# Patient Record
Sex: Male | Born: 1980 | Hispanic: Yes | Marital: Single | Smoking: Never smoker
Health system: Southern US, Community
[De-identification: ages and names within clinical notes are randomized; demographics above are authoritative.]

## PROBLEM LIST (undated history)

## (undated) DIAGNOSIS — Z789 Other specified health status: Secondary | ICD-10-CM

## (undated) HISTORY — PX: APPENDECTOMY: SHX54

## (undated) HISTORY — PX: ANKLE FRACTURE SURGERY: SHX122

## (undated) HISTORY — PX: SMALL INTESTINE SURGERY: SHX150

---

## 2002-12-01 ENCOUNTER — Encounter: Payer: Self-pay | Admitting: *Deleted

## 2002-12-01 ENCOUNTER — Inpatient Hospital Stay (HOSPITAL_COMMUNITY): Admission: EM | Admit: 2002-12-01 | Discharge: 2002-12-02 | Payer: Self-pay | Admitting: *Deleted

## 2002-12-01 ENCOUNTER — Encounter (INDEPENDENT_AMBULATORY_CARE_PROVIDER_SITE_OTHER): Payer: Self-pay

## 2010-02-20 ENCOUNTER — Emergency Department (HOSPITAL_COMMUNITY): Admission: EM | Admit: 2010-02-20 | Discharge: 2010-02-20 | Payer: Self-pay | Admitting: Emergency Medicine

## 2011-02-27 LAB — RAPID STREP SCREEN (MED CTR MEBANE ONLY): Streptococcus, Group A Screen (Direct): NEGATIVE

## 2011-04-22 NOTE — Op Note (Signed)
   Jimmy Sanchez, Jimmy Sanchez                            ACCOUNT NO.:  [id number]   MEDICAL RECORD NO.:  [id number]                   PATIENT TYPE:  INP   LOCATION:  2852                                 FACILITY:  [facility]   PHYSICIAN:  Vikki Ports, M.D.         DATE OF BIRTH:  March 18, 1981   DATE OF PROCEDURE:  12/01/2002  DATE OF DISCHARGE:                                 OPERATIVE REPORT   PREOPERATIVE DIAGNOSIS:  Acute appendicitis.   POSTOPERATIVE DIAGNOSIS:  Acute appendicitis.   OPERATION/PROCEDURE:  Laparoscopic appendectomy.   SURGEON:  Vikki Ports, M.D.   ANESTHESIA:  General.   DESCRIPTION OF PROCEDURE:  The patient was taken to the operating room,  placed in the supine position after adequate anesthesia was induced using  endotracheal tube.  The abdomen was prepped and draped in the normal sterile  fashion.  Using a small vertical midline supraumbilical incision, I  dissected down to the fascia.  The fascia was opened vertically.  An 0  Vicryl pursestring suture was placed around the fascial defect.  Peritoneum  was entered with the Hasson trocar.  Pneumoperitoneum was obtained.  Under  direct visualization a 5 mm port was placed in the right upper quadrant and  after clearing intestinal incisions to the right lower quadrant, a 12 mm  trocar was placed in the left paramedian supraumbilical region.  The cecum  was identified.  A very thickened and inflamed appendix was mobilized up and  out of the pelvis.  The mesoappendix was taken down with the Harmonic  scalpel.  The base of the appendix was transected using an endoscopic GIA  stapling device.  Appendix was placed in an endocatch bag and removed  through the umbilical port.  Pelvis and right lower quadrant were copiously  irrigated.  Adequate hemostasis was insured.  The supraumbilical fascial  defect was closed with the 0 Vicryl pursestring suture.  All other incisions  were closed.  The skin incisions were  closed with staples.  The patient  tolerated the procedure well and went to PACU in good condition.                                               Vikki Ports, M.D.    KRH/MEDQ  D:  12/01/2002  T:  12/01/2002  Job:  [id number]

## 2011-11-13 ENCOUNTER — Ambulatory Visit (INDEPENDENT_AMBULATORY_CARE_PROVIDER_SITE_OTHER): Payer: BC Managed Care – PPO

## 2011-11-13 DIAGNOSIS — Z139 Encounter for screening, unspecified: Secondary | ICD-10-CM

## 2012-09-12 ENCOUNTER — Telehealth: Payer: Self-pay

## 2012-09-12 ENCOUNTER — Ambulatory Visit (INDEPENDENT_AMBULATORY_CARE_PROVIDER_SITE_OTHER): Payer: BC Managed Care – PPO | Admitting: Family Medicine

## 2012-09-12 VITALS — BP 105/62 | HR 55 | Temp 97.9°F | Resp 16 | Ht 67.5 in | Wt 166.0 lb

## 2012-09-12 DIAGNOSIS — R42 Dizziness and giddiness: Secondary | ICD-10-CM

## 2012-09-12 DIAGNOSIS — R110 Nausea: Secondary | ICD-10-CM

## 2012-09-12 LAB — POCT URINALYSIS DIPSTICK
Bilirubin, UA: NEGATIVE
Blood, UA: NEGATIVE
Glucose, UA: NEGATIVE
Ketones, UA: NEGATIVE
Leukocytes, UA: NEGATIVE
Nitrite, UA: NEGATIVE
Protein, UA: NEGATIVE
Spec Grav, UA: 1.01
Urobilinogen, UA: 0.2
pH, UA: 6

## 2012-09-12 LAB — POCT UA - MICROSCOPIC ONLY
Casts, Ur, LPF, POC: NEGATIVE
Crystals, Ur, HPF, POC: NEGATIVE
Mucus, UA: NEGATIVE
Yeast, UA: NEGATIVE

## 2012-09-12 LAB — POCT CBC
Granulocyte percent: 58.9 % (ref 37–80)
HCT, POC: 46.5 % (ref 43.5–53.7)
Hemoglobin: 14.9 g/dL (ref 14.1–18.1)
Lymph, poc: 2 (ref 0.6–3.4)
MCH, POC: 30.2 pg (ref 27–31.2)
MCHC: 32 g/dL (ref 31.8–35.4)
MCV: 94.3 fL (ref 80–97)
MID (cbc): 0.4 (ref 0–0.9)
MPV: 8.3 fL (ref 0–99.8)
POC Granulocyte: 3.5 (ref 2–6.9)
POC LYMPH PERCENT: 34.2 % (ref 10–50)
POC MID %: 6.9 % (ref 0–12)
Platelet Count, POC: 294 K/uL (ref 142–424)
RBC: 4.93 M/uL (ref 4.69–6.13)
RDW, POC: 13.4 %
WBC: 5.9 K/uL (ref 4.6–10.2)

## 2012-09-12 LAB — GLUCOSE, POCT (MANUAL RESULT ENTRY): POC Glucose: 83 mg/dL (ref 70–99)

## 2012-09-12 MED ORDER — ONDANSETRON HCL 8 MG PO TABS: 8.0000 mg | ORAL_TABLET | Freq: Three times a day (TID) | ORAL | Status: DC | PRN

## 2012-09-12 NOTE — Telephone Encounter (Signed)
PT IS WANTING TO KNOW IF YOU CAN TEST FOR CARBON MONOXIDE WITH THE BLOOD THAT WAS DRAWN WHEN PT WAS SEEN IN OFFICE ON 09-12-12 [phone]

## 2012-09-12 NOTE — Progress Notes (Signed)
[facility] [address] [place] [address] [phone]  Date:  09/12/2012   Name:  Jimmy Sanchez   DOB:  10-08-1981   MRN:  [id number]  PCP:  No primary provider on file.    Chief Complaint: Nausea, Dizziness and Chills   History of Present Illness:  Jimmy Sanchez is a 31 y.o. very pleasant male patient who presents with the following:  He felt nauseated and lightheaded this morning.  Symptoms first started after he had gotten ready for the day and eaten breakfast.   No abdominal pain.   He feels weak and tired but no specific symptoms. .   Nothing hurts.   He did have a problem with his throat a couple of days ago.  His partner notes that sometimes at night it sounds like he is choking on very large tonsils.  This is not new  No cough, no fever.   He is usually healthy. No vomiting or diarrhea.  No medications tried.  No Rx medications, but he does take a supplement "for his bones."     Also states that sometimes- not now- he has RUQ pains.   There is no problem list on file for this patient.   No past medical history on file.  No past surgical history on file.  History  Substance Use Topics  . Smoking status: Never Smoker   . Smokeless tobacco: Not on file  . Alcohol Use: Yes    No family history on file.  No Known Allergies  Medication list has been reviewed and updated.  No current outpatient prescriptions on file prior to visit.    Review of Systems:  As per HPI- otherwise negative.   Physical Examination: Filed Vitals:   09/12/12 1155  BP: 105/62  Pulse: 55  Temp: 97.9 F (36.6 C)  Resp: 16   Filed Vitals:   09/12/12 1155  Height: 5' 7.5" (1.715 m)  Weight: 166 lb (75.297 kg)   Body mass index is 25.62 kg/(m^2). Ideal Body Weight: Weight in (lb) to have BMI = 25: 161.7   GEN: WDWN, NAD, Non-toxic, A & O x 3, looks well HEENT: Atraumatic, Normocephalic. Neck supple. No masses, No LAD. Bilateral TM wnl, oropharynx  normal. Tonsils are small, uvula wnl.  PEERL,EOMI.   Ears and Nose: No external deformity. CV: RRR, No M/G/R. No JVD. No thrill. No extra heart sounds. PULM: CTA B, no wheezes, crackles, rhonchi. No retractions. No resp. distress. No accessory muscle use. ABD: S, NT, ND, +BS. No rebound. No HSM.  Healed surgical scars.  EXTR: No c/c/e NEURO Normal gait.  PSYCH: Normally interactive. Conversant. Not depressed or anxious appearing.  Calm demeanor.   Results for orders placed in visit on 09/12/12  POCT CBC      Component Value Range   WBC 5.9  4.6 - 10.2 K/uL   Lymph, poc 2.0  0.6 - 3.4   POC LYMPH PERCENT 34.2  10 - 50 %L   MID (cbc) 0.4  0 - 0.9   POC MID % 6.9  0 - 12 %M   POC Granulocyte 3.5  2 - 6.9   Granulocyte percent 58.9  37 - 80 %G   RBC 4.93  4.69 - 6.13 M/uL   Hemoglobin 14.9  14.1 - 18.1 g/dL   HCT, POC 69.6  29.5 - 53.7 %   MCV 94.3  80 - 97 fL   MCH, POC 30.2  27 - 31.2 pg  MCHC 32.0  31.8 - 35.4 g/dL   RDW, POC 47.8     Platelet Count, POC 294  142 - 424 K/uL   MPV 8.3  0 - 99.8 fL  GLUCOSE, POCT (MANUAL RESULT ENTRY)      Component Value Range   POC Glucose 83  70 - 99 mg/dl  POCT UA - MICROSCOPIC ONLY      Component Value Range   WBC, Ur, HPF, POC rare     RBC, urine, microscopic rare     Bacteria, U Microscopic trace     Mucus, UA neg     Epithelial cells, urine per micros 0-1     Crystals, Ur, HPF, POC neg     Casts, Ur, LPF, POC neg     Yeast, UA neg    POCT URINALYSIS DIPSTICK      Component Value Range   Color, UA yellow     Clarity, UA clear     Glucose, UA neg     Bilirubin, UA neg     Ketones, UA neg     Spec Grav, UA 1.010     Blood, UA neg     pH, UA 6.0     Protein, UA neg     Urobilinogen, UA 0.2     Nitrite, UA neg     Leukocytes, UA Negative      Assessment and Plan: 1. Nausea  Comprehensive metabolic panel, ondansetron (ZOFRAN) 8 MG tablet  2. Lightheaded  POCT CBC, POCT glucose (manual entry), POCT UA - Microscopic Only, POCT  urinalysis dipstick   31 year old male with non- specific symptoms and reassuring exam/ VS and labs today.  Encouraged him to rest and drink plenty of fluids.  He may be coming down with a viral illness.  zofran as needed for nausea.  Let me know if not better in the next day or so- Sooner if worse.   Will follow- up with CMP result as well.  Will check CMP as he sometimes has RUQ pains COPLAND,JESSICA, MD

## 2012-09-13 ENCOUNTER — Telehealth: Payer: Self-pay | Admitting: *Deleted

## 2012-09-13 LAB — COMPREHENSIVE METABOLIC PANEL
ALT: 14 U/L (ref 0–53)
AST: 20 U/L (ref 0–37)
Albumin: 5 g/dL (ref 3.5–5.2)
Alkaline Phosphatase: 44 U/L (ref 39–117)
BUN: 11 mg/dL (ref 6–23)
CO2: 28 mEq/L (ref 19–32)
Calcium: 10 mg/dL (ref 8.4–10.5)
Chloride: 104 mEq/L (ref 96–112)
Creat: 0.87 mg/dL (ref 0.50–1.35)
Glucose, Bld: 83 mg/dL (ref 70–99)
Potassium: 4.6 mEq/L (ref 3.5–5.3)
Sodium: 138 mEq/L (ref 135–145)
Total Bilirubin: 0.5 mg/dL (ref 0.3–1.2)
Total Protein: 7.9 g/dL (ref 6.0–8.3)

## 2012-09-13 NOTE — Telephone Encounter (Signed)
Left a message for patient. Informed him that his lab work was normal and to call back if he was not feeling better.

## 2012-09-13 NOTE — Telephone Encounter (Signed)
No, I do not think that we can test for carbon monoxide.  Is he concerned about this?  If he is afraid that he has carbon monoxide exposure he probably should go to the ER.  What is going on?

## 2012-09-13 NOTE — Telephone Encounter (Signed)
Called patient, left message to advise. 

## 2012-09-13 NOTE — Telephone Encounter (Signed)
He is instructed to call me back so I can advise on this, need to know why he thinks he has Carbon Monoxide poisoning.

## 2012-09-16 NOTE — Telephone Encounter (Signed)
Called patient. There is a language barrier. He is advised we can not test for this, he did not indicate why he thinks he had carbon monoxide poisoning.

## 2013-07-25 ENCOUNTER — Emergency Department (HOSPITAL_COMMUNITY): Payer: BC Managed Care – PPO

## 2013-07-25 ENCOUNTER — Inpatient Hospital Stay (HOSPITAL_COMMUNITY)
Admission: EM | Admit: 2013-07-25 | Discharge: 2013-07-25 | DRG: 814 | Discharge status: Against Medical Advice | Payer: BC Managed Care – PPO | Attending: Internal Medicine | Admitting: Internal Medicine

## 2013-07-25 ENCOUNTER — Encounter (HOSPITAL_COMMUNITY): Payer: Self-pay | Admitting: Emergency Medicine

## 2013-07-25 DIAGNOSIS — K56609 Unspecified intestinal obstruction, unspecified as to partial versus complete obstruction: Secondary | ICD-10-CM

## 2013-07-25 DIAGNOSIS — R112 Nausea with vomiting, unspecified: Secondary | ICD-10-CM | POA: Diagnosis present

## 2013-07-25 DIAGNOSIS — K56600 Partial intestinal obstruction, unspecified as to cause: Secondary | ICD-10-CM

## 2013-07-25 DIAGNOSIS — R109 Unspecified abdominal pain: Secondary | ICD-10-CM | POA: Diagnosis present

## 2013-07-25 DIAGNOSIS — Z9089 Acquired absence of other organs: Secondary | ICD-10-CM

## 2013-07-25 HISTORY — DX: Other specified health status: Z78.9

## 2013-07-25 LAB — CBC WITH DIFFERENTIAL/PLATELET
Eosinophils Absolute: 0.3 10*3/uL (ref 0.0–0.7)
Eosinophils Relative: 0 % (ref 0–5)
Eosinophils Relative: 3 % (ref 0–5)
HCT: 40.8 % (ref 39.0–52.0)
HCT: 42.6 % (ref 39.0–52.0)
Hemoglobin: 14.8 g/dL (ref 13.0–17.0)
Hemoglobin: 15.1 g/dL (ref 13.0–17.0)
Lymphocytes Relative: 10 % — ABNORMAL LOW (ref 12–46)
Lymphs Abs: 2.1 10*3/uL (ref 0.7–4.0)
MCH: 31 pg (ref 26.0–34.0)
MCV: 86.8 fL (ref 78.0–100.0)
MCV: 87.5 fL (ref 78.0–100.0)
Monocytes Absolute: 0.3 10*3/uL (ref 0.1–1.0)
Monocytes Absolute: 0.7 10*3/uL (ref 0.1–1.0)
Monocytes Relative: 3 % (ref 3–12)
Monocytes Relative: 5 % (ref 3–12)
Neutro Abs: 8.3 10*3/uL — ABNORMAL HIGH (ref 1.7–7.7)
RBC: 4.87 MIL/uL (ref 4.22–5.81)
RDW: 12.9 % (ref 11.5–15.5)
WBC: 9.6 10*3/uL (ref 4.0–10.5)

## 2013-07-25 LAB — COMPREHENSIVE METABOLIC PANEL
AST: 19 U/L (ref 0–37)
Albumin: 3.9 g/dL (ref 3.5–5.2)
Alkaline Phosphatase: 40 U/L (ref 39–117)
BUN: 21 mg/dL (ref 6–23)
BUN: 16 mg/dL (ref 6–23)
Calcium: 9.5 mg/dL (ref 8.4–10.5)
Chloride: 106 mEq/L (ref 96–112)
GFR calc Af Amer: >90 mL/min (ref >90)
GFR calc non Af Amer: >90 mL/min (ref >90)
Glucose, Bld: 100 mg/dL — ABNORMAL HIGH (ref 70–99)
Potassium: 3.9 mEq/L (ref 3.5–5.1)
Total Bilirubin: 0.4 mg/dL (ref 0.3–1.2)
Total Protein: 7.7 g/dL (ref 6.0–8.3)
Total Protein: 7 g/dL (ref 6.0–8.3)

## 2013-07-25 LAB — URINALYSIS, ROUTINE W REFLEX MICROSCOPIC
Leukocytes, UA: NEGATIVE
Nitrite: NEGATIVE
Specific Gravity, Urine: 1.024 (ref 1.005–1.030)
Urobilinogen, UA: 0.2 mg/dL (ref 0.0–1.0)
pH: 7.5 (ref 5.0–8.0)

## 2013-07-25 LAB — LACTIC ACID, PLASMA: Lactic Acid, Venous: 0.5 mmol/L (ref 0.5–2.2)

## 2013-07-25 LAB — RAPID URINE DRUG SCREEN, HOSP PERFORMED: Benzodiazepines: NOT DETECTED

## 2013-07-25 LAB — LIPASE, BLOOD: Lipase: 28 U/L (ref 11–59)

## 2013-07-25 MED ORDER — SODIUM CHLORIDE 0.9 % IV SOLN
INTRAVENOUS | Status: DC
  Administered 2013-07-25: 06:00:00 via INTRAVENOUS

## 2013-07-25 MED ORDER — HYDROMORPHONE HCL PF 1 MG/ML IJ SOLN
1.0000 mg | Freq: Once | INTRAMUSCULAR | Status: AC
  Administered 2013-07-25: 1 mg via INTRAVENOUS
  Filled 2013-07-25: qty 1

## 2013-07-25 MED ORDER — ONDANSETRON HCL 4 MG PO TABS: 4.0000 mg | ORAL_TABLET | Freq: Four times a day (QID) | ORAL | Status: DC | PRN

## 2013-07-25 MED ORDER — CIPROFLOXACIN IN D5W 400 MG/200ML IV SOLN
400.0000 mg | Freq: Two times a day (BID) | INTRAVENOUS | Status: DC
  Administered 2013-07-25: 400 mg via INTRAVENOUS
  Filled 2013-07-25 (×2): qty 200

## 2013-07-25 MED ORDER — FENTANYL CITRATE 0.05 MG/ML IJ SOLN
50.0000 ug | Freq: Once | INTRAMUSCULAR | Status: AC
  Administered 2013-07-25: 50 ug via INTRAVENOUS
  Filled 2013-07-25: qty 2

## 2013-07-25 MED ORDER — ONDANSETRON HCL 4 MG/2ML IJ SOLN
4.0000 mg | Freq: Once | INTRAMUSCULAR | Status: AC
  Administered 2013-07-25: 4 mg via INTRAVENOUS
  Filled 2013-07-25: qty 2

## 2013-07-25 MED ORDER — ONDANSETRON HCL 4 MG/2ML IJ SOLN
4.0000 mg | Freq: Four times a day (QID) | INTRAMUSCULAR | Status: DC | PRN
  Administered 2013-07-25: 4 mg via INTRAVENOUS
  Filled 2013-07-25: qty 2

## 2013-07-25 MED ORDER — IOHEXOL 300 MG/ML  SOLN
80.0000 mL | Freq: Once | INTRAMUSCULAR | Status: AC | PRN
  Administered 2013-07-25: 80 mL via INTRAVENOUS

## 2013-07-25 MED ORDER — HYDROMORPHONE HCL PF 1 MG/ML IJ SOLN
1.0000 mg | INTRAMUSCULAR | Status: DC | PRN
  Administered 2013-07-25: 1 mg via INTRAVENOUS
  Filled 2013-07-25 (×2): qty 1

## 2013-07-25 MED ORDER — SODIUM CHLORIDE 0.9 % IV BOLUS (SEPSIS)
1000.0000 mL | Freq: Once | INTRAVENOUS | Status: AC
  Administered 2013-07-25: 1000 mL via INTRAVENOUS

## 2013-07-25 MED ORDER — ACETAMINOPHEN 650 MG RE SUPP: 650.0000 mg | Freq: Four times a day (QID) | RECTAL | Status: DC | PRN

## 2013-07-25 MED ORDER — METRONIDAZOLE IN NACL 5-0.79 MG/ML-% IV SOLN
500.0000 mg | Freq: Three times a day (TID) | INTRAVENOUS | Status: DC
  Administered 2013-07-25: 500 mg via INTRAVENOUS
  Filled 2013-07-25 (×3): qty 100

## 2013-07-25 MED ORDER — ACETAMINOPHEN 325 MG PO TABS: 650.0000 mg | ORAL_TABLET | Freq: Four times a day (QID) | ORAL | Status: DC | PRN

## 2013-07-25 NOTE — Progress Notes (Signed)
Pt states " I feel much better after I vomited, call doctor and see if I can be released"? This nurse called doctor and doctor advised not to leave and to stay overnight for observation. Patient was informed about leaving against medical advise and stated "okay Im leaving".

## 2013-07-25 NOTE — Progress Notes (Signed)
32yo male c/o mid-abdominal pain, denies N/V, to begin IV ABX for enteritis.  Will start Cipro 400mg  IV Q12H for CrCl >100 and monitor clinical progression.  Vernard Gambles, PharmD, BCPS  07/25/2013 5:56 AM

## 2013-07-25 NOTE — ED Notes (Signed)
PT. REPORTS MID ABDOMINAL PAIN ONSET THIS EVENING , DENIES NAUSEA OR VOMITTING , NO DIARRHEA OR FEVER .

## 2013-07-25 NOTE — H&P (Signed)
[facility] History and Physical  Donevan Biller JYN:[id number] DOB: 1980-12-22 DOA: 07/25/2013  Referring physician: ER physician. PCP: No PCP Per Patient   Chief Complaint: Abdominal pain.  HPI: Jimmy Sanchez is a 32 y.o. male with no significant past medical history presents with complaints of abdominal pain. Patient's abdominal pain started last night around 9 PM. The pain was periumbilical crampy in nature which became more persistent. Denies any nausea vomiting or diarrhea. Denies any fever chills. Denies any similar episodes previously. Patient has had abdominal surgery as a child the cause of which is not known and also had appendectomy later. In the ER CT abdomen shows possibility of early small bowel obstruction/enteritis/decreased motolity/malabsorption. Patient's pain improved with Dilaudid. Patient at this time has been admitted for further observation. Patient presently is not in acute distress. On exam patient has a mild tenderness around the periumbilical area. Patient's last bowel movement was last night. Denies any diarrhea. Patient has not had any weight loss or joint symptoms.  Review of Systems: As presented in the history of presenting illness, rest negative.  Past Medical History  Diagnosis Date  . Medical history non-contributory    Past Surgical History  Procedure Laterality Date  . Appendectomy    . Small intestine surgery      as an infant   Social History:  reports that he has never smoked. He does not have any smokeless tobacco history on file. He reports that  drinks alcohol. His drug history is not on file. Home. where does patient live-- Can do ADLs. Can patient participate in ADLs?  No Known Allergies  Family History  Problem Relation Age of Onset  . Other Neg Hx       Prior to Admission medications   Not on File   Physical Exam: Filed Vitals:   07/25/13 0007 07/25/13 0145 07/25/13 0200 07/25/13 0431  BP: 115/74 114/53 104/53 128/73   Pulse: 82 68 63 62  Temp: 98.6 F (37 C)   98.2 F (36.8 C)  TempSrc: Oral     Resp: 20   18  Height:    5\' 8"  (1.727 m)  Weight:    79 kg (174 lb 2.6 oz)  SpO2: 97% 95% 96% 99%     General:  Well-developed and nourished.  Eyes: Anicteric no pallor.  ENT: No discharge from the ears eyes nose mouth.  Neck: No mass felt.  Cardiovascular: S1-S2 heard.  Respiratory: No rhonchi or crepitations.  Abdomen: Soft mild tenderness around the umbilicus. No guarding or rigidity. Bowel sounds present.  Skin: No rash.  Musculoskeletal: No joint effusions.  Psychiatric: Appears normal.  Neurologic: Alert awake oriented to time place and person. Moves all extremities.  Labs on Admission:  Basic Metabolic Panel:  Recent Labs Lab 07/25/13 0015  NA 140  K 3.7  CL 103  CO2 26  GLUCOSE 100*  BUN 21  CREATININE 0.79  CALCIUM 9.5   Liver Function Tests:  Recent Labs Lab 07/25/13 0015  AST 22  ALT 19  ALKPHOS 47  BILITOT 0.3  PROT 7.7  ALBUMIN 4.5    Recent Labs Lab 07/25/13 0015  LIPASE 28   No results found for this basename: AMMONIA,  in the last 168 hours CBC:  Recent Labs Lab 07/25/13 0015  WBC 13.4*  NEUTROABS 10.2*  HGB 15.1  HCT 42.6  MCV 87.5  PLT 240   Cardiac Enzymes: No results found for this basename: CKTOTAL, CKMB, CKMBINDEX, TROPONINI,  in the last  168 hours  BNP (last 3 results) No results found for this basename: PROBNP,  in the last 8760 hours CBG: No results found for this basename: GLUCAP,  in the last 168 hours  Radiological Exams on Admission: Ct Abdomen Pelvis W Contrast  07/25/2013   *RADIOLOGY REPORT*  Clinical Data: Abdominal pain, onset this evening.  Dilated small bowel on plain film.  CT ABDOMEN AND PELVIS WITH CONTRAST  Technique:  Multidetector CT imaging of the abdomen and pelvis was performed following the standard protocol during bolus administration of intravenous contrast.  Contrast: 80mL OMNIPAQUE IOHEXOL 300  MG/ML  SOLN  Comparison: Abdominal series 07/25/2013  Findings: Mild dependent changes in the lung bases.  Sub centimeter low attenuation lesion in the medial segment left lobe of the liver likely representing small cyst.  Liver parenchyma is otherwise homogeneous.  The gallbladder, spleen, pancreas, adrenal glands, kidneys, abdominal aorta, inferior vena cava, and retroperitoneal lymph nodes are unremarkable.  There is prominent gastric distension with food and contrast material in the stomach. Contrast material does flow into the duodenum and there is no obvious obstructing lesion.  Changes may be due to dysmotility or gastroenteritis.  Proximal small bowel are decompressed.  The colon is mostly decompressed.  The distal small bowel are mildly dilated and filled with fluid and gas.  Findings could represent early distal obstruction, enteritis, decreased motility with stasis or malabsorption process.  No free air or free fluid in the abdomen. Small periumbilical hernia containing fat.  Pelvis:  Prostate gland is not enlarged.  Bladder wall is not thickened.  Calcification versus surgical clip in the right pelvis. Appendix is not visualized consistent with surgical absence. No free or loculated pelvic fluid collections.  No evidence of diverticulitis.  Normal alignment of the lumbar spine.  No destructive bone lesions appreciated.  IMPRESSION:  Mildly dilated distal small bowel which is filled with fluid and gas.  Colon is decompressed.  Changes suggest early distal obstruction, enteritis, decreased motility, or malabsorption.  The stomach is distended without obvious mass lesion.   Original Report Authenticated By: Burman Nieves, M.D.   Dg Abd Acute W/chest  07/25/2013   *RADIOLOGY REPORT*  Clinical Data: Severe abdominal pain.  History of surgery is in the for small bowel obstruction.  Lower abdominal pain started 4 hours ago.  ACUTE ABDOMEN SERIES (ABDOMEN 2 VIEW & CHEST 1 VIEW)  Comparison: Chest 06/26/2013   Findings: The The heart size and pulmonary vascularity are normal. The lungs appear clear and expanded without focal air space disease or consolidation. No blunting of the costophrenic angles.  No pneumothorax.  Mediastinal contours appear intact.  No significant change since previous study.  Stomach appears distended with fluid.  Mild gaseous distension of mid abdominal small bowel loops with a few air-fluid levels. Changes suggest early obstruction. Ileus is less likely.  Stool in the colon without distension.  No free intra-abdominal air. Surgical clips in the right lower quadrant.  No radiopaque stones. Visualized bones appear intact.  IMPRESSION: The mild gaseous distension and air-fluid levels and mid abdominal small bowel loops.  Stomach appears distended with fluid.  Changes suggest early obstruction.   Original Report Authenticated By: Burman Nieves, M.D.     Assessment/Plan Principal Problem:   Abdominal pain   1. Abdominal pain - possibilities include early small bowel obstruction versus enteritis. Other causes include malabsorption and decreased motility. Patient does have bowel sounds. At this time I am placing patient n.p.o. Pain relief medications. Cipro and Flagyl(possible  antritis) and may repeat KUB later in the day    Code Status: Full code.  Family Communication: None.  Disposition Plan: Admit to inpatient.    Darcie Mellone N. [facility] Pager [phone].  If 7PM-7AM, please contact night-coverage [email or web address] Password [facility] 07/25/2013, 5:49 AM

## 2013-07-25 NOTE — ED Provider Notes (Signed)
CSN: [id number]     Arrival date & time 07/25/13  0001 History     First MD Initiated Contact with Patient 07/25/13 0047     Chief Complaint  Patient presents with  . Abdominal Pain   (Consider location/radiation/quality/duration/timing/severity/associated sxs/prior Treatment) HPI 32 yo male presents to the ER from home with complaint of acute lower abdominal pain this evening around 9 pm.  Pt had BM to see if it would relieve the pain, took alka-seltzer as well without improvement in pain.  No nausea or vomiting, no fevers.  Pt with pmh of appendectomy and small intestine surgery as infant due to "being stuck together".  No prior h/o SBO.  Pain is sharp, crampy.    History reviewed. No pertinent past medical history. Past Surgical History  Procedure Laterality Date  . Appendectomy    . Small intestine surgery      as an infant   No family history on file. History  Substance Use Topics  . Smoking status: Never Smoker   . Smokeless tobacco: Not on file  . Alcohol Use: Yes    Review of Systems  All other systems reviewed and are negative.    Allergies  Review of patient's allergies indicates no known allergies.  Home Medications  No current outpatient prescriptions on file. BP 104/53  Pulse 63  Temp(Src) 98.6 F (37 C) (Oral)  Resp 20  SpO2 96% Physical Exam  Nursing note and vitals reviewed. Constitutional: He is oriented to person, place, and time. He appears well-developed and well-nourished.  HENT:  Head: Normocephalic and atraumatic.  Right Ear: External ear normal.  Left Ear: External ear normal.  Nose: Nose normal.  Mouth/Throat: Oropharynx is clear and moist.  Eyes: Conjunctivae and EOM are normal. Pupils are equal, round, and reactive to light.  Neck: Normal range of motion. Neck supple. No JVD present. No tracheal deviation present. No thyromegaly present.  Cardiovascular: Normal rate, regular rhythm, normal heart sounds and intact distal pulses.  Exam  reveals no gallop and no friction rub.   No murmur heard. Pulmonary/Chest: Effort normal and breath sounds normal. No stridor. No respiratory distress. He has no wheezes. He has no rales. He exhibits no tenderness.  Abdominal: Soft. Bowel sounds are normal. He exhibits no distension and no mass. There is no tenderness. There is no rebound and no guarding.  Musculoskeletal: Normal range of motion. He exhibits no edema and no tenderness.  Lymphadenopathy:    He has no cervical adenopathy.  Neurological: He is oriented to person, place, and time. He has normal reflexes. No cranial nerve deficit. He exhibits normal muscle tone. Coordination normal.  Skin: Skin is dry. No rash noted. No erythema. No pallor.  Psychiatric: He has a normal mood and affect. His behavior is normal. Judgment and thought content normal.    ED Course   Procedures (including critical care time)  Labs Reviewed  CBC WITH DIFFERENTIAL - Abnormal; Notable for the following:    WBC 13.4 (*)    Neutro Abs 10.2 (*)    All other components within normal limits  COMPREHENSIVE METABOLIC PANEL - Abnormal; Notable for the following:    Glucose, Bld 100 (*)    All other components within normal limits  URINALYSIS, ROUTINE W REFLEX MICROSCOPIC - Abnormal; Notable for the following:    Ketones, ur 40 (*)    All other components within normal limits  LIPASE, BLOOD   Ct Abdomen Pelvis W Contrast  07/25/2013   *RADIOLOGY REPORT*  Clinical Data: Abdominal pain, onset this evening.  Dilated small bowel on plain film.  CT ABDOMEN AND PELVIS WITH CONTRAST  Technique:  Multidetector CT imaging of the abdomen and pelvis was performed following the standard protocol during bolus administration of intravenous contrast.  Contrast: 80mL OMNIPAQUE IOHEXOL 300 MG/ML  SOLN  Comparison: Abdominal series 07/25/2013  Findings: Mild dependent changes in the lung bases.  Sub centimeter low attenuation lesion in the medial segment left lobe of the liver  likely representing small cyst.  Liver parenchyma is otherwise homogeneous.  The gallbladder, spleen, pancreas, adrenal glands, kidneys, abdominal aorta, inferior vena cava, and retroperitoneal lymph nodes are unremarkable.  There is prominent gastric distension with food and contrast material in the stomach. Contrast material does flow into the duodenum and there is no obvious obstructing lesion.  Changes may be due to dysmotility or gastroenteritis.  Proximal small bowel are decompressed.  The colon is mostly decompressed.  The distal small bowel are mildly dilated and filled with fluid and gas.  Findings could represent early distal obstruction, enteritis, decreased motility with stasis or malabsorption process.  No free air or free fluid in the abdomen. Small periumbilical hernia containing fat.  Pelvis:  Prostate gland is not enlarged.  Bladder wall is not thickened.  Calcification versus surgical clip in the right pelvis. Appendix is not visualized consistent with surgical absence. No free or loculated pelvic fluid collections.  No evidence of diverticulitis.  Normal alignment of the lumbar spine.  No destructive bone lesions appreciated.  IMPRESSION:  Mildly dilated distal small bowel which is filled with fluid and gas.  Colon is decompressed.  Changes suggest early distal obstruction, enteritis, decreased motility, or malabsorption.  The stomach is distended without obvious mass lesion.   Original Report Authenticated By: Burman Nieves, M.D.   Dg Abd Acute W/chest  07/25/2013   *RADIOLOGY REPORT*  Clinical Data: Severe abdominal pain.  History of surgery is in the for small bowel obstruction.  Lower abdominal pain started 4 hours ago.  ACUTE ABDOMEN SERIES (ABDOMEN 2 VIEW & CHEST 1 VIEW)  Comparison: Chest 06/26/2013  Findings: The The heart size and pulmonary vascularity are normal. The lungs appear clear and expanded without focal air space disease or consolidation. No blunting of the costophrenic  angles.  No pneumothorax.  Mediastinal contours appear intact.  No significant change since previous study.  Stomach appears distended with fluid.  Mild gaseous distension of mid abdominal small bowel loops with a few air-fluid levels. Changes suggest early obstruction. Ileus is less likely.  Stool in the colon without distension.  No free intra-abdominal air. Surgical clips in the right lower quadrant.  No radiopaque stones. Visualized bones appear intact.  IMPRESSION: The mild gaseous distension and air-fluid levels and mid abdominal small bowel loops.  Stomach appears distended with fluid.  Changes suggest early obstruction.   Original Report Authenticated By: Burman Nieves, M.D.   1. Small bowel obstruction, partial   2. Abdominal pain     MDM  32 yo male with acute abd pain, concern for SBO, kidney stone.  No blood in urine, CT scan with possible early sbo.  No vomiting, do not feel pt needs NGT at this time.  D/w medicine for admission.  Olivia Mackie, MD 07/25/13 [id number]

## 2013-07-25 NOTE — Discharge Summary (Signed)
Physician Discharge Summary  Jimmy Sanchez:[id number] DOB: 1981-06-02 DOA: 07/25/2013  PCP: No PCP Per Patient  Patient left AMA   Admit date: 07/25/2013 Discharge date: 07/25/2013  Time spent: Less than 30 minutes  Recommendations for Outpatient Follow-up:  1. Patient did not wait for recommendations  Discharge Diagnoses:  Principal Problem:   Abdominal pain   Discharge Condition: Guarded  Diet recommendation: None made  Filed Weights   07/25/13 0431  Weight: 79 kg (174 lb 2.6 oz)    History of present illness:  32 year old male patient with no significant past medical history, prior abdominal surgery as a child of unknown details & appendectomy, admitted on 8/21 with complaints of abdominal pain that started the night prior. He denied nausea, vomiting or diarrhea. In the ED, CT abdomen showed possibility of early SBO versus enteritis versus dysmotility. He was admitted for further management.  Hospital Course:  Patient was admitted to the hospital. He was made n.p.o. and placed on IV fluids, pain medications and empiric antibiotics-Cipro and Flagyl. Earlier this morning, patient continued to complain of diffuse abdominal pain and bloated sensation. He had nausea but no vomiting. His last BM was last night. He had not passed any flatus since yesterday. Examination revealed a young moderately nourished and built male patient in mild painful distress. Vital signs were stable. Abdominal exam showed nondistended abdomen but diffuse mild tenderness with associated guarding but no rigidity or rebound. No organomegaly or masses are felt. Other system examinations were unremarkable. After discussing with patient and his male friend at bedside, consulted surgery for further evaluation. Patient subsequently had an episode of emesis and his symptoms significantly improved and he requested to go home. Advised patient that his symptoms may have transiently improved and may return and hence  recommend overnight observation. Patient apparently declined this recommendation and signed out AMA.  Procedures:  None   Consultations:   Gen. Surgery was consulted but patient left before being seen.  Discharge Exam: This was exam done in the morning when he had no plans of leaving.  Complaints:  Filed Vitals:   07/25/13 0007 07/25/13 0145 07/25/13 0200 07/25/13 0431  BP: 115/74 114/53 104/53 128/73  Pulse: 82 68 63 62  Temp: 98.6 F (37 C)   98.2 F (36.8 C)  TempSrc: Oral     Resp: 20   18  Height:    5\' 8"  (1.727 m)  Weight:    79 kg (174 lb 2.6 oz)  SpO2: 97% 95% 96% 99%     Physical exam as above.  Discharge Instructions     Medication List    Notice   You have not been prescribed any medications.        The results of significant diagnostics from this hospitalization (including imaging, microbiology, ancillary and laboratory) are listed below for reference.    Significant Diagnostic Studies: Ct Abdomen Pelvis W Contrast  07/25/2013   *RADIOLOGY REPORT*  Clinical Data: Abdominal pain, onset this evening.  Dilated small bowel on plain film.  CT ABDOMEN AND PELVIS WITH CONTRAST  Technique:  Multidetector CT imaging of the abdomen and pelvis was performed following the standard protocol during bolus administration of intravenous contrast.  Contrast: 80mL OMNIPAQUE IOHEXOL 300 MG/ML  SOLN  Comparison: Abdominal series 07/25/2013  Findings: Mild dependent changes in the lung bases.  Sub centimeter low attenuation lesion in the medial segment left lobe of the liver likely representing small cyst.  Liver parenchyma is otherwise homogeneous.  The gallbladder, spleen,  pancreas, adrenal glands, kidneys, abdominal aorta, inferior vena cava, and retroperitoneal lymph nodes are unremarkable.  There is prominent gastric distension with food and contrast material in the stomach. Contrast material does flow into the duodenum and there is no obvious obstructing lesion.  Changes  may be due to dysmotility or gastroenteritis.  Proximal small bowel are decompressed.  The colon is mostly decompressed.  The distal small bowel are mildly dilated and filled with fluid and gas.  Findings could represent early distal obstruction, enteritis, decreased motility with stasis or malabsorption process.  No free air or free fluid in the abdomen. Small periumbilical hernia containing fat.  Pelvis:  Prostate gland is not enlarged.  Bladder wall is not thickened.  Calcification versus surgical clip in the right pelvis. Appendix is not visualized consistent with surgical absence. No free or loculated pelvic fluid collections.  No evidence of diverticulitis.  Normal alignment of the lumbar spine.  No destructive bone lesions appreciated.  IMPRESSION:  Mildly dilated distal small bowel which is filled with fluid and gas.  Colon is decompressed.  Changes suggest early distal obstruction, enteritis, decreased motility, or malabsorption.  The stomach is distended without obvious mass lesion.   Original Report Authenticated By: Burman Nieves, M.D.   Dg Abd Acute W/chest  07/25/2013   *RADIOLOGY REPORT*  Clinical Data: Severe abdominal pain.  History of surgery is in the for small bowel obstruction.  Lower abdominal pain started 4 hours ago.  ACUTE ABDOMEN SERIES (ABDOMEN 2 VIEW & CHEST 1 VIEW)  Comparison: Chest 06/26/2013  Findings: The The heart size and pulmonary vascularity are normal. The lungs appear clear and expanded without focal air space disease or consolidation. No blunting of the costophrenic angles.  No pneumothorax.  Mediastinal contours appear intact.  No significant change since previous study.  Stomach appears distended with fluid.  Mild gaseous distension of mid abdominal small bowel loops with a few air-fluid levels. Changes suggest early obstruction. Ileus is less likely.  Stool in the colon without distension.  No free intra-abdominal air. Surgical clips in the right lower quadrant.  No  radiopaque stones. Visualized bones appear intact.  IMPRESSION: The mild gaseous distension and air-fluid levels and mid abdominal small bowel loops.  Stomach appears distended with fluid.  Changes suggest early obstruction.   Original Report Authenticated By: Burman Nieves, M.D.    Microbiology: No results found for this or any previous visit (from the past 240 hour(s)).   Labs: Basic Metabolic Panel:  Recent Labs Lab 07/25/13 0015 07/25/13 0730  NA 140 137  K 3.7 3.9  CL 103 106  CO2 26 21  GLUCOSE 100* 130*  BUN 21 16  CREATININE 0.79 0.67  CALCIUM 9.5 8.9   Liver Function Tests:  Recent Labs Lab 07/25/13 0015 07/25/13 0730  AST 22 19  ALT 19 16  ALKPHOS 47 40  BILITOT 0.3 0.4  PROT 7.7 7.0  ALBUMIN 4.5 3.9    Recent Labs Lab 07/25/13 0015  LIPASE 28   No results found for this basename: AMMONIA,  in the last 168 hours CBC:  Recent Labs Lab 07/25/13 0015 07/25/13 0730  WBC 13.4* 9.6  NEUTROABS 10.2* 8.3*  HGB 15.1 14.8  HCT 42.6 40.8  MCV 87.5 86.8  PLT 240 235   Cardiac Enzymes: No results found for this basename: CKTOTAL, CKMB, CKMBINDEX, TROPONINI,  in the last 168 hours BNP: BNP (last 3 results) No results found for this basename: PROBNP,  in the last 8760 hours  CBG: No results found for this basename: GLUCAP,  in the last 168 hours  Additional labs:    Signed:  HONGALGI,ANAND  [facility] 07/25/2013, 7:44 PM

## 2013-09-23 ENCOUNTER — Other Ambulatory Visit: Payer: Self-pay | Admitting: Gastroenterology

## 2013-09-23 DIAGNOSIS — R109 Unspecified abdominal pain: Secondary | ICD-10-CM

## 2013-09-23 DIAGNOSIS — R112 Nausea with vomiting, unspecified: Secondary | ICD-10-CM

## 2013-10-02 ENCOUNTER — Other Ambulatory Visit: Payer: Self-pay | Admitting: Gastroenterology

## 2013-10-02 ENCOUNTER — Ambulatory Visit
Admission: RE | Admit: 2013-10-02 | Discharge: 2013-10-02 | Disposition: A | Discharge status: Home / Self Care | Payer: BC Managed Care – PPO | Source: Ambulatory Visit | Attending: Gastroenterology | Admitting: Gastroenterology

## 2013-10-02 DIAGNOSIS — R109 Unspecified abdominal pain: Secondary | ICD-10-CM

## 2013-10-02 DIAGNOSIS — R112 Nausea with vomiting, unspecified: Secondary | ICD-10-CM

## 2014-07-22 ENCOUNTER — Ambulatory Visit (INDEPENDENT_AMBULATORY_CARE_PROVIDER_SITE_OTHER): Payer: BC Managed Care – PPO | Admitting: Family Medicine

## 2014-07-22 VITALS — BP 106/60 | HR 55 | Temp 98.3°F | Resp 16 | Ht 66.25 in | Wt 161.1 lb

## 2014-07-22 DIAGNOSIS — L255 Unspecified contact dermatitis due to plants, except food: Secondary | ICD-10-CM

## 2014-07-22 DIAGNOSIS — L237 Allergic contact dermatitis due to plants, except food: Secondary | ICD-10-CM

## 2014-07-22 MED ORDER — PREDNISONE 20 MG PO TABS: 40.0000 mg | ORAL_TABLET | Freq: Every day | ORAL | Status: DC

## 2014-07-22 NOTE — Progress Notes (Signed)
This chart was scribed for Jimmy Sidle, MD by Luisa Dago, ED Scribe. This patient was seen in room 1 and the patient's care was started at 7:47 PM.   Patient ID: Jimmy Sanchez MRN: [id number], DOB: 1981/07/16, 33 y.o. Date of Encounter: 07/22/2014, 7:45 PM  Primary Physician: No PCP Per Patient  Chief Complaint:  Chief Complaint  Patient presents with  . Rash     HPI: 33 y.o. year old male with history below   Pt is here in the office complaining of a rash to his left forearm and left side of his abdomen that he first noticed 3 days ago. He reports coming in contact with some mold. Pt states that the itching is much worse at night. Denies any fever, chills, nausea, abdominal pain, diaphoresis, chest pain, or SOB.   Past Medical History  Diagnosis Date  . Medical history non-contributory      Home Meds: Prior to Admission medications   Not on File    Allergies: No Known Allergies  History   Social History  . Marital Status: Single    Spouse Name: N/A    Number of Children: N/A  . Years of Education: N/A   Occupational History  . Not on file.   Social History Main Topics  . Smoking status: Never Smoker   . Smokeless tobacco: Never Used  . Alcohol Use: No  . Drug Use: No  . Sexual Activity: Not on file   Other Topics Concern  . Not on file   Social History Narrative  . No narrative on file     Review of Systems: positive for rash Constitutional: negative for chills, fever, night sweats, weight changes, or fatigue  HEENT: negative for vision changes, hearing loss, congestion, rhinorrhea, ST, epistaxis, or sinus pressure Cardiovascular: negative for chest pain or palpitations Respiratory: negative for hemoptysis, wheezing, shortness of breath, or cough Abdominal: negative for abdominal pain, nausea, vomiting, diarrhea, or constipation Dermatological: negative for rash Neurologic: negative for headache, dizziness, or syncope All other systems  reviewed and are otherwise negative with the exception to those above and in the HPI.   Physical Exam: pt has a papular vesicular rash to his left forearm   Blood pressure 106/60, pulse 55, temperature 98.3 F (36.8 C), temperature source Oral, resp. rate 16, height 5' 6.25" (1.683 m), weight 161 lb 2 oz (73.086 kg), SpO2 99.00%., Body mass index is 25.8 kg/(m^2). General: Well developed, well nourished, in no acute distress. Head: Normocephalic, atraumatic, eyes without discharge, sclera non-icteric, nares are without discharge. Bilateral auditory canals clear, TM's are without perforation, pearly grey and translucent with reflective cone of light bilaterally. Oral cavity moist, posterior pharynx without exudate, erythema, peritonsillar abscess, or post nasal drip.  Neck: Supple. No thyromegaly. Full ROM. No lymphadenopathy. Lungs: Clear bilaterally to auscultation without wheezes, rales, or rhonchi. Breathing is unlabored. Heart: RRR with S1 S2. No murmurs, rubs, or gallops appreciated. Abdomen: Soft, non-tender, non-distended with normoactive bowel sounds. No hepatomegaly. No rebound/guarding. No obvious abdominal masses. Msk:  Strength and tone normal for age. Extremities/Skin: Warm and dry. No clubbing or cyanosis. No edema. No rashes or suspicious lesions. Neuro: Alert and oriented X 3. Moves all extremities spontaneously. Gait is normal. CNII-XII grossly in tact. Psych:  Responds to questions appropriately with a normal affect.     ASSESSMENT AND PLAN:  33 y.o. year old male with Poison ivy - Plan: predniSONE (DELTASONE) 20 MG tablet  I personally performed the services described in  this documentation, which was scribed in my presence. The recorded information has been reviewed and is accurate.  Signed, Jimmy SidleKurt Jeptha Hinnenkamp, MD 07/22/2014 7:45 PM

## 2015-03-06 ENCOUNTER — Emergency Department (HOSPITAL_COMMUNITY)
Admission: EM | Admit: 2015-03-06 | Discharge: 2015-03-06 | Disposition: A | Discharge status: Home / Self Care | Payer: BLUE CROSS/BLUE SHIELD | Attending: Emergency Medicine | Admitting: Emergency Medicine

## 2015-03-06 ENCOUNTER — Emergency Department (HOSPITAL_COMMUNITY): Payer: BLUE CROSS/BLUE SHIELD

## 2015-03-06 ENCOUNTER — Encounter (HOSPITAL_COMMUNITY): Payer: Self-pay | Admitting: *Deleted

## 2015-03-06 DIAGNOSIS — Y998 Other external cause status: Secondary | ICD-10-CM | POA: Diagnosis not present

## 2015-03-06 DIAGNOSIS — Y9302 Activity, running: Secondary | ICD-10-CM | POA: Insufficient documentation

## 2015-03-06 DIAGNOSIS — Y92322 Soccer field as the place of occurrence of the external cause: Secondary | ICD-10-CM | POA: Diagnosis not present

## 2015-03-06 DIAGNOSIS — X58XXXA Exposure to other specified factors, initial encounter: Secondary | ICD-10-CM | POA: Insufficient documentation

## 2015-03-06 DIAGNOSIS — S99912A Unspecified injury of left ankle, initial encounter: Secondary | ICD-10-CM | POA: Diagnosis present

## 2015-03-06 DIAGNOSIS — S82832A Other fracture of upper and lower end of left fibula, initial encounter for closed fracture: Secondary | ICD-10-CM | POA: Diagnosis not present

## 2015-03-06 DIAGNOSIS — S82402A Unspecified fracture of shaft of left fibula, initial encounter for closed fracture: Secondary | ICD-10-CM

## 2015-03-06 MED ORDER — NAPROXEN 375 MG PO TABS: 375.0000 mg | ORAL_TABLET | Freq: Two times a day (BID) | ORAL | Status: DC

## 2015-03-06 MED ORDER — IBUPROFEN 400 MG PO TABS
400.0000 mg | ORAL_TABLET | Freq: Once | ORAL | Status: AC
Start: 2015-03-06 — End: 2015-03-06
  Administered 2015-03-06: 400 mg via ORAL
  Filled 2015-03-06: qty 1

## 2015-03-06 MED ORDER — HYDROCODONE-ACETAMINOPHEN 5-325 MG PO TABS: 1.0000 | ORAL_TABLET | ORAL | Status: DC | PRN

## 2015-03-06 MED ORDER — OXYCODONE-ACETAMINOPHEN 5-325 MG PO TABS
1.0000 | ORAL_TABLET | Freq: Once | ORAL | Status: DC
  Filled 2015-03-06: qty 1

## 2015-03-06 NOTE — Discharge Instructions (Signed)
Cuidados del yeso o la férula °(Cast or Splint Care) °El yeso y las férulas sostienen los miembros lesionados y evitan que los huesos se muevan hasta que se curen. Es importante que cuide el yeso o la férula cuando se encuentre en su casa.   °INSTRUCCIONES PARA EL CUIDADO EN EL HOGAR °· Mantenga el yeso o la férula al descubierto durante el tiempo de secado. Puede tardar entre 24 y 48 horas para secarse si está hecho de yeso. La fibra de vidrio se seca en menos de 1 hora. °· No apoye el yeso sobre nada que sea más duro que una almohada durante 24 horas. °· No aplique peso sobre el miembro lesionado ni haga presión sobre el yeso hasta que el médico lo autorice. °· Mantenga el yeso o la férula secos. Al mojarse pueden perder la forma y podría ocurrir que no soporten el miembro. Un yeso mojado que ha perdido su forma puede presionar de manera peligrosa en la piel al secarse. Además, la piel mojada podría infectarse. °· Cubra el yeso o la férula con una bolsa plástica cuando tome un baño o cuando salga al exterior en días de lluvia o nieve. Si el yeso está colocado sobre el tronco, deberá bañarse pasando una esponja por el cuerpo, hasta que se lo retiren. °· Si el yeso se moja, séquelo con una toalla o con un secador de cabello sólo en posición de aire frío. °· Mantenga el yeso o la férula limpios. Si el yeso se ensucia, puede limpiarlo con un paño húmedo. °· No coloque objetos extraños duros o blandos debajo del yeso o cabestrillo, como algodón, papel higiénico, loción o talco. °· No se rasque la piel por debajo del molde con ningún objeto. Podría quedar adherido al yeso. Además, el rascado puede causar una infección. Si siente picazón, use un secador de cabello con aire frío sobre la zona que pica para aliviar las molestias. °· No recorte ni quite el relleno acolchado que se encuentra debajo del yeso. °· Ejercite todas las articulaciones que no estén inmovilizadas por el yeso o férula. Por ejemplo, si tiene un yeso  largo de pierna, ejercite la articulación de la cadera y los dedos de los pies. Si tiene un brazo enyesado o entablillado, ejercite el hombro, el codo, el pulgar y los dedos de la mano. °· Eleve el brazo o la pierna sobre 1 ó 2 almohadas durante los primeros 3 días para disminuir la hinchazón y el dolor.Es mejor si puede elevar cómodamente el yeso para que quede más arriba del nivel del corazón. °SOLICITE ATENCIÓN MÉDICA SI:  °· El yeso o la férula se quiebran. °· Siente que el yeso o la férula están muy apretados o muy flojos. °· Tiene una picazón insoportable debajo del yeso. °· El yeso se moja o tiene una zona blanda. °· Siente un feo olor que proviene del interior del yeso. °· Algún objeto se queda atascado bajo el yeso. °· La piel que rodea el yeso enrojece o se vuelve sensible. °· Siente un dolor nuevo o el dolor que sentía empeora luego de la aplicación del yeso. °SOLICITE ATENCIÓN MÉDICA DE INMEDIATO SI:  °· Observa un líquido que sale por el yeso. °· No puede mover el dedo lesionado. °· Los dedos le cambian de color (blancos o azules), siente frío, dolor o por fuera del yeso los dedos están muy inflamados. °· Siente hormigueo o adormecimiento alrededor de la zona de la lesión. °· Siente un dolor o presión intensos debajo del yeso. °·   Presenta dificultad para respirar o Company secretaryle falta el aire.  Siente dolor en el pecho. Document Released: 11/21/2005 Document Revised: 09/11/2013 [facility] Patient Information 2015 [place], [place]. This information is not intended to replace advice given to you by your health care provider. Make sure you discuss any questions you have with your health care provider.  Fractura de tibia y peron en adultos (Tibial and Fibular Fracture, Adult) La fractura de tibia y peron es una quebradura en los huesos de la parte inferior de la pierna (tibia y peron). La tibia es la ms grande de AGCO Corporationlos dos huesos. El peron es el ms pequeo. Est en la parte externa de la pierna.   CAUSAS  Lesiones de bajo impacto, como una cada en el nivel del suelo.  Lesiones de alto impacto, como lesiones en motocicletas, heridas de armas de fuego o colisiones en la prctica de deportes de alta velocidad. FACTORES DE RIESGO  Actividades de salto.  Estrs repetitivo, como carreras de larga distancia.  Participacin en deportes.  Osteoporosis.  Edad avanzada. SIGNOS Y SNTOMAS  Dolor.  Hinchazn.  Incapacidad para Consulting civil engineercolocar el peso en la pierna lesionada.  Deformidades seas en el lugar de la lesin.  Moretones. DIAGNSTICO  Las fracturas de tibia y peron se diagnostican mediante el uso de radiografas. TRATAMIENTO  Si tiene una fractura simple de [place]estos huesos, se puede tratar con una sencilla inmovilizacin. Se utilizar un yeso o una frula en la pierna para mantenerla inmovilizada Livoniamientras se [place]. Luego puede comenzar con ejercicios de amplitud de movimiento para recuperar la movilidad de la rodilla. INSTRUCCIONES PARA EL CUIDADO EN EL HOGAR   Aplique hielo en la pierna.  Ponga el hielo en una bolsa plstica.  Coloque una toalla entre la piel y la bolsa de hielo.  Deje el hielo durante 20 minutos, 2 a 3 veces por da.  Si le colocaron un yeso o un molde de Monroefibra de vidrio:  No trate de rascarse la piel por debajo del molde utilizando objetos filosos o puntiagudos.  Controle todos los Darden Restaurantsdas la piel de alrededor del yeso. Puede colocarse una locin en las zonas rojas o doloridas.  Mantenga el yeso seco y limpio.  Si tiene una frula de yeso:  selo del modo en que se lo indicaron.  Puede aflojar el elstico que rodea la tablilla si los dedos se entumecen, siente hormigueos, se enfran o se vuelven de color azul.  No ejerza presin en ninguna parte del yeso o tablilla hasta que se haya endurecido, ya que podra deformarse.  Puede proteger el yeso o la tablilla durante el bao con una bolsa plstica. No los sumerja en el agua.  Utilice las Murphy Oilmuletas  del modo en que se lo indicaron.  Utilice los medicamentos de venta libre o recetados para Primary school teachercalmar el dolor, el malestar o la fiebre, segn se lo indique el mdico.  Siga todas las instrucciones que se le haya dado su mdico.  Programe y cumpla con todas las visitas de control. SOLICITE ATENCIN MDICA SI:  El Metallurgistdolor empeora en lugar de mejorar, o si no es controlable con los medicamentos.  Aumenta la hinchazn o el enrojecimiento en el pie.  Comienza a perder la sensibilidad en el pie o en los dedos. SOLICITE ATENCIN MDICA DE INMEDIATO SI:  El pie o los dedos del lado lesionado se tornan fros o de color azul  Siente dolor intenso en la pierna lesionada, especialmente si aumenta el dolor con el movimiento de los dedos. ASEGRESE DE  QUE:  Comprende estas instrucciones.  Controlar su afeccin.  Recibir ayuda de inmediato si no mejora o si empeora. Document Released: 08/31/2005 Document Revised: 09/11/2013 [facility] Patient Information 2015 [place], [place]. This information is not intended to replace advice given to you by your health care provider. Make sure you discuss any questions you have with your health care provider.

## 2015-03-06 NOTE — ED Notes (Addendum)
Pt states he was playing soccer and went to kick the ball and injured his left ankle. Obvious swelling to left ankle present. Good capillary refill and pulse present in left foot. Pt states he took a pain reliever but is unsure what kind.

## 2015-03-06 NOTE — ED Provider Notes (Signed)
CSN: [id number]     Arrival date & time 03/06/15  2151 History   First MD Initiated Contact with Patient 03/06/15 2208     Chief Complaint  Patient presents with  . Ankle Injury   Patient is a 34 y.o. male presenting with lower extremity injury. The history is provided by the patient. No language interpreter was used.  Ankle Injury    This chart was scribed for non-physician practitioner Marlon Peliffany Tao Satz, PA-C, working with Glynn OctaveStephen Rancour, MD, by Andrew Auaven Small, ED Scribe. This patient was seen in room TR07C/TR07C and the patient's care was started at 11:16 PM.  Jimmy Sanchez is a 34 y.o. male who presents to the Emergency Department complaining of left ankle injury that occurred about 3 hours ago. Pt states he was running while playing soccer when he twisted his ankle running by myself. Pt states he has not been able to walk or bear weight to left ankle since injury. Pt has taken [employer] IndustriesBio Electra pain medication. Pt denies head injury and LOC. Denies hx of broken bones or any other injury.   Past Medical History  Diagnosis Date  . Medical history non-contributory    Past Surgical History  Procedure Laterality Date  . Appendectomy    . Small intestine surgery      as an infant   Family History  Problem Relation Age of Onset  . Other Neg Hx   . Heart disease Mother    History  Substance Use Topics  . Smoking status: Never Smoker   . Smokeless tobacco: Never Used  . Alcohol Use: No    Review of Systems  Musculoskeletal: Positive for arthralgias and gait problem.  All other systems reviewed and are negative.  Allergies  Review of patient's allergies indicates no known allergies.  Home Medications   Prior to Admission medications   Medication Sig Start Date End Date Taking? Authorizing Provider  ibuprofen (ADVIL,MOTRIN) 200 MG tablet Take 200 mg by mouth every 6 (six) hours as needed for moderate pain.   Yes Historical Provider, MD  HYDROcodone-acetaminophen (NORCO/VICODIN) 5-325 MG  per tablet Take 1-2 tablets by mouth every 4 (four) hours as needed. 03/06/15   Haydee Jabbour Neva SeatGreene, PA-C  naproxen (NAPROSYN) 375 MG tablet Take 1 tablet (375 mg total) by mouth 2 (two) times daily. 03/06/15   Marilynn Ekstein Neva SeatGreene, PA-C  predniSONE (DELTASONE) 20 MG tablet Take 2 tablets (40 mg total) by mouth daily. Patient not taking: Reported on 03/06/2015 07/22/14   Elvina SidleKurt Lauenstein, MD   BP 130/74 mmHg  Pulse 84  Temp(Src) 98.3 F (36.8 C) (Oral)  Resp 18  Ht 5\' 7"  (1.702 m)  Wt 163 lb (73.936 kg)  BMI 25.52 kg/m2  SpO2 97% Physical Exam  Constitutional: He is oriented to person, place, and time. He appears well-developed and well-nourished. No distress.  HENT:  Head: Normocephalic and atraumatic.  Eyes: Conjunctivae and EOM are normal.  Neck: Neck supple.  Cardiovascular: Normal rate.   Pulmonary/Chest: Effort normal.  Musculoskeletal:       Left ankle: He exhibits decreased range of motion, swelling and ecchymosis. He exhibits no deformity, no laceration and normal pulse. Tenderness. Lateral malleolus tenderness found. No medial malleolus and no AITFL tenderness found. Achilles tendon normal.       Feet:  Neurological: He is alert and oriented to person, place, and time.  Skin: Skin is warm and dry.  Psychiatric: He has a normal mood and affect. His behavior is normal.  Nursing note and vitals reviewed.  ED Course  Procedures (including critical care time) DIAGNOSTIC STUDIES: Oxygen Saturation is 97% on RA, normal by my interpretation.    COORDINATION OF CARE: 11:16 PM- Pt advised of plan for treatment which include  pain medication, antiinflammatory, a temporary cast and a referral to an orthopedist and pt agrees. Pt has been advised rest, elevate, and ice ankle.   11:16 PM I spoke with Dr. Greig Right associate who recommends patient arrives to office at 8:30am for a f/u appt with  Dr. Eulah Pont, T. He has been instructed to keep ankle elevated as much as possible.  Ice 20 min on/ 20 min  off whenever he can. Non weight bearing.  Labs Review Labs Reviewed - No data to display  Imaging Review Dg Ankle Complete Left  03/06/2015   CLINICAL DATA:  Soccer injury, twisted left ankle  EXAM: LEFT ANKLE COMPLETE - 3+ VIEW  COMPARISON:  None.  FINDINGS: Oblique/spiral fracture of the distal fibula. Overlying moderate soft tissue swelling.  Ankle mortise is otherwise preserved.  The base of the fifth metatarsal is unremarkable.  IMPRESSION: Oblique/spiral fracture of the distal fibula.   Electronically Signed   By: Charline Bills M.D.   On: 03/06/2015 22:28     EKG Interpretation None      MDM   Final diagnoses:  Closed fibular fracture, left, initial encounter   Foot NVI. FROM of all 5 toes. No head or neck pain/ no loc. Pt was running by myself and twisted ankle.  Placed in short leg splint and given crutches 33 y.o.Jimmy Sanchez's evaluation in the Emergency Department is complete. It has been determined that no acute conditions requiring further emergency intervention are present at this time. The patient/guardian have been advised of the diagnosis and plan. We have discussed signs and symptoms that warrant return to the ED, such as changes or worsening in symptoms.  Vital signs are stable at discharge. Filed Vitals:   03/06/15 2156  BP: 130/74  Pulse: 84  Temp: 98.3 F (36.8 C)  Resp: 18    Patient/guardian has voiced understanding and agreed to follow-up with the PCP or specialist.   I personally performed the services described in this documentation, which was scribed in my presence. The recorded information has been reviewed and is accurate.    Marlon Pel, PA-C 03/06/15 2316  Glynn Octave, MD 03/07/15 [phone]

## 2015-03-06 NOTE — ED Notes (Signed)
Ortho tech paged and responded to page to apply splint short leg

## 2015-03-07 NOTE — Progress Notes (Signed)
Orthopedic Tech Progress Note Patient Details:  Jimmy Sanchez 1981-08-17 [id number]  Ortho Devices Type of Ortho Device: Crutches, Ace wrap, Post (short leg) splint Ortho Device/Splint Interventions: Application   Cammer, Mickie BailJennifer Carol 03/07/2015, 1:09 AM

## 2016-12-02 ENCOUNTER — Emergency Department (HOSPITAL_COMMUNITY): Payer: BLUE CROSS/BLUE SHIELD

## 2016-12-02 ENCOUNTER — Emergency Department (HOSPITAL_COMMUNITY)
Admission: EM | Admit: 2016-12-02 | Discharge: 2016-12-02 | Disposition: A | Discharge status: Home / Self Care | Payer: BLUE CROSS/BLUE SHIELD | Attending: Emergency Medicine | Admitting: Emergency Medicine

## 2016-12-02 ENCOUNTER — Encounter (HOSPITAL_COMMUNITY): Payer: Self-pay

## 2016-12-02 DIAGNOSIS — X501XXA Overexertion from prolonged static or awkward postures, initial encounter: Secondary | ICD-10-CM | POA: Insufficient documentation

## 2016-12-02 DIAGNOSIS — Y999 Unspecified external cause status: Secondary | ICD-10-CM | POA: Diagnosis not present

## 2016-12-02 DIAGNOSIS — Z79899 Other long term (current) drug therapy: Secondary | ICD-10-CM | POA: Diagnosis not present

## 2016-12-02 DIAGNOSIS — Y9389 Activity, other specified: Secondary | ICD-10-CM | POA: Insufficient documentation

## 2016-12-02 DIAGNOSIS — S63502A Unspecified sprain of left wrist, initial encounter: Secondary | ICD-10-CM | POA: Insufficient documentation

## 2016-12-02 DIAGNOSIS — Y929 Unspecified place or not applicable: Secondary | ICD-10-CM | POA: Diagnosis not present

## 2016-12-02 DIAGNOSIS — S6992XA Unspecified injury of left wrist, hand and finger(s), initial encounter: Secondary | ICD-10-CM | POA: Diagnosis present

## 2016-12-02 MED ORDER — NAPROXEN 375 MG PO TABS: 375.0000 mg | ORAL_TABLET | Freq: Two times a day (BID) | ORAL | 0 refills | Status: AC | PRN

## 2016-12-02 MED ORDER — HYDROCODONE-ACETAMINOPHEN 5-325 MG PO TABS: 1.0000 | ORAL_TABLET | Freq: Once | ORAL | Status: DC

## 2016-12-02 NOTE — ED Provider Notes (Signed)
[facility]-EMERGENCY DEPT Provider Note   CSN: [id number] Arrival date & time: 12/02/16  2105     History   Chief Complaint Chief Complaint  Patient presents with  . Wrist Pain    HPI Jimmy Sanchez is a [age] y.o. male.  HPI 35 yo RHD male her with left wrist pain. Pt was picking up his child in a car seat when he felt a "pop" in his left wrist. He then experienced gradually worsening aching, throbbing, dull left wrist pain along the ulnar aspect of his wrist. He has associated swelling. Pain is persistent but worse with movement and palpation. Denies any alleviating factors. No direct trauma.  Past Medical History:  Diagnosis Date  . Medical history non-contributory     Patient Active Problem List   Diagnosis Date Noted  . Abdominal pain 07/25/2013    Past Surgical History:  Procedure Laterality Date  . ANKLE FRACTURE SURGERY    . APPENDECTOMY    . SMALL INTESTINE SURGERY     as an infant       Home Medications    Prior to Admission medications   Medication Sig Start Date End Date Taking? Authorizing Provider  HYDROcodone-acetaminophen (NORCO/VICODIN) 5-325 MG per tablet Take 1-2 tablets by mouth every 4 (four) hours as needed. 03/06/15   Tiffany Neva SeatGreene, PA-C  ibuprofen (ADVIL,MOTRIN) 200 MG tablet Take 200 mg by mouth every 6 (six) hours as needed for moderate pain.    Historical Provider, MD  naproxen (NAPROSYN) 375 MG tablet Take 1 tablet (375 mg total) by mouth 2 (two) times daily as needed for moderate pain. 12/02/16 12/09/16  Shaune Pollackameron Sarvesh Meddaugh, MD  predniSONE (DELTASONE) 20 MG tablet Take 2 tablets (40 mg total) by mouth daily. Patient not taking: Reported on 03/06/2015 07/22/14   Elvina SidleKurt Lauenstein, MD    Family History Family History  Problem Relation Age of Onset  . Heart disease Mother   . Other Neg Hx     Social History Social History  Substance Use Topics  . Smoking status: Never Smoker  . Smokeless tobacco: Never Used  . Alcohol use Yes     Comment: occ      Allergies   Patient has no known allergies.   Review of Systems Review of Systems  Constitutional: Negative for chills, fatigue and fever.  HENT: Negative for congestion and rhinorrhea.   Eyes: Negative for visual disturbance.  Respiratory: Negative for shortness of breath.   Cardiovascular: Negative for chest pain.  Musculoskeletal: Negative for neck pain.  Skin: Negative for rash and wound.  Allergic/Immunologic: Negative for immunocompromised state.  Neurological: Negative for weakness and numbness.  Hematological: Does not bruise/bleed easily.     Physical Exam Updated Vital Signs BP 110/67   Pulse 78   Temp 98.2 F (36.8 C) (Oral)   Resp 16   Ht 5\' 7"  (1.702 m)   Wt 172 lb (78 kg)   SpO2 97%   BMI 26.94 kg/m   Physical Exam  Constitutional: He is oriented to person, place, and time. He appears well-developed and well-nourished. No distress.  HENT:  Head: Normocephalic and atraumatic.  Eyes: Conjunctivae are normal.  Neck: Neck supple.  Cardiovascular: Normal rate, regular rhythm and normal heart sounds.   Pulmonary/Chest: Effort normal. No respiratory distress. He has no wheezes.  Abdominal: He exhibits no distension.  Musculoskeletal: He exhibits no edema.  Neurological: He is alert and oriented to person, place, and time. He exhibits normal muscle tone.  Skin: Skin is warm.  Capillary refill takes less than 2 seconds. No rash noted.  Nursing note and vitals reviewed.    UPPER EXTREMITY EXAM: LEFT  INSPECTION & PALPATION: Mild swelling and TTP over ulnar aspect of wrist with no obvious deformity.   SENSORY: Sensation is intact to light touch in:  Superficial radial nerve distribution (dorsal first web space) Median nerve distribution (tip of index finger)   Ulnar nerve distribution (tip of small finger)     MOTOR:  + Motor posterior interosseous nerve (thumb IP extension) + Anterior interosseous nerve (thumb IP flexion, index finger DIP  flexion) + Radial nerve (wrist extension) + Median nerve (palpable firing thenar mass) + Ulnar nerve (palpable firing of first dorsal interosseous muscle)   VASCULAR: 2+ radial pulse Brisk capillary refill < 2 sec, fingers warm and well-perfused  TENDONS: Normal flexion, extension, abduction, adduction of fifth digit Normal ulnar and radial deviation at the wrist with no tenderness  COMPARTMENTS: Soft, warm, well-perfused No pain with passive extension No paresthesias    ED Treatments / Results  Labs (all labs ordered are listed, but only abnormal results are displayed) Labs Reviewed - No data to display  EKG  EKG Interpretation None       Radiology Dg Wrist Complete Left  Result Date: 12/02/2016 CLINICAL DATA:  Left wrist pain and swelling after hearing a pop picking child in car carrier. EXAM: LEFT WRIST - COMPLETE 3+ VIEW COMPARISON:  None. FINDINGS: There is no evidence of fracture or dislocation. Mild ulna positive variance. Tiny density just distal to the radial aspect of the ulna is of uncertain significance but appears chronic. The scaphoid is intact. No definite soft tissue abnormality defined radiographically. IMPRESSION: No acute fracture or subluxation of the left wrist. Mild ulna positive variance. Electronically Signed   By: Rubye OaksMelanie  Ehinger M.D.   On: 12/02/2016 22:39    Procedures Procedures (including critical care time)  Medications Ordered in ED Medications - No data to display   Initial Impression / Assessment and Plan / ED Course  I have reviewed the triage vital signs and the nursing notes.  Pertinent labs & imaging results that were available during my care of the patient were reviewed by me and considered in my medical decision making (see chart for details).  Clinical Course     35 yo RHD male here with left wrist pain after picking up car seat. On exam, pt has TTP over ulnar aspect of wrist with no deformity or bruising. No open wounds.  Plain films neg. Suspect wrist sprain. No signs of significant ligamentous injury. Pt has normal, full, painless ulnar/radial deviation of the wrist. Will place in wrist splint, give NSAIDs, and advise PCP f/u with possible referral to hand if sx do not improve.  Final Clinical Impressions(s) / ED Diagnoses   Final diagnoses:  Sprain of left wrist, initial encounter    New Prescriptions Discharge Medication List as of 12/02/2016 11:05 PM       Shaune Pollackameron Waris Rodger, MD 12/03/16 0010

## 2016-12-02 NOTE — ED Triage Notes (Signed)
Pt endorses left wrist pain after picking up his baby carrier. Pt states "i heard something pop" Pt has full ROM of left wrist and able to make a fist. No obvious deformity. CMS intact.

## 2016-12-02 NOTE — ED Notes (Signed)
Patient Alert and oriented X4. Stable and ambulatory. Patient verbalized understanding of the discharge instructions.  Patient belongings were taken by the patient.  

## 2017-09-26 IMAGING — CR DG WRIST COMPLETE 3+V*L*
4 series · 4 of 4 positions shown · non-contrast
Comparison: None.

CLINICAL DATA: Left wrist pain and swelling after hearing a pop
picking child in car carrier.

EXAM:
LEFT WRIST - COMPLETE 3+ VIEW

[wrist pa]
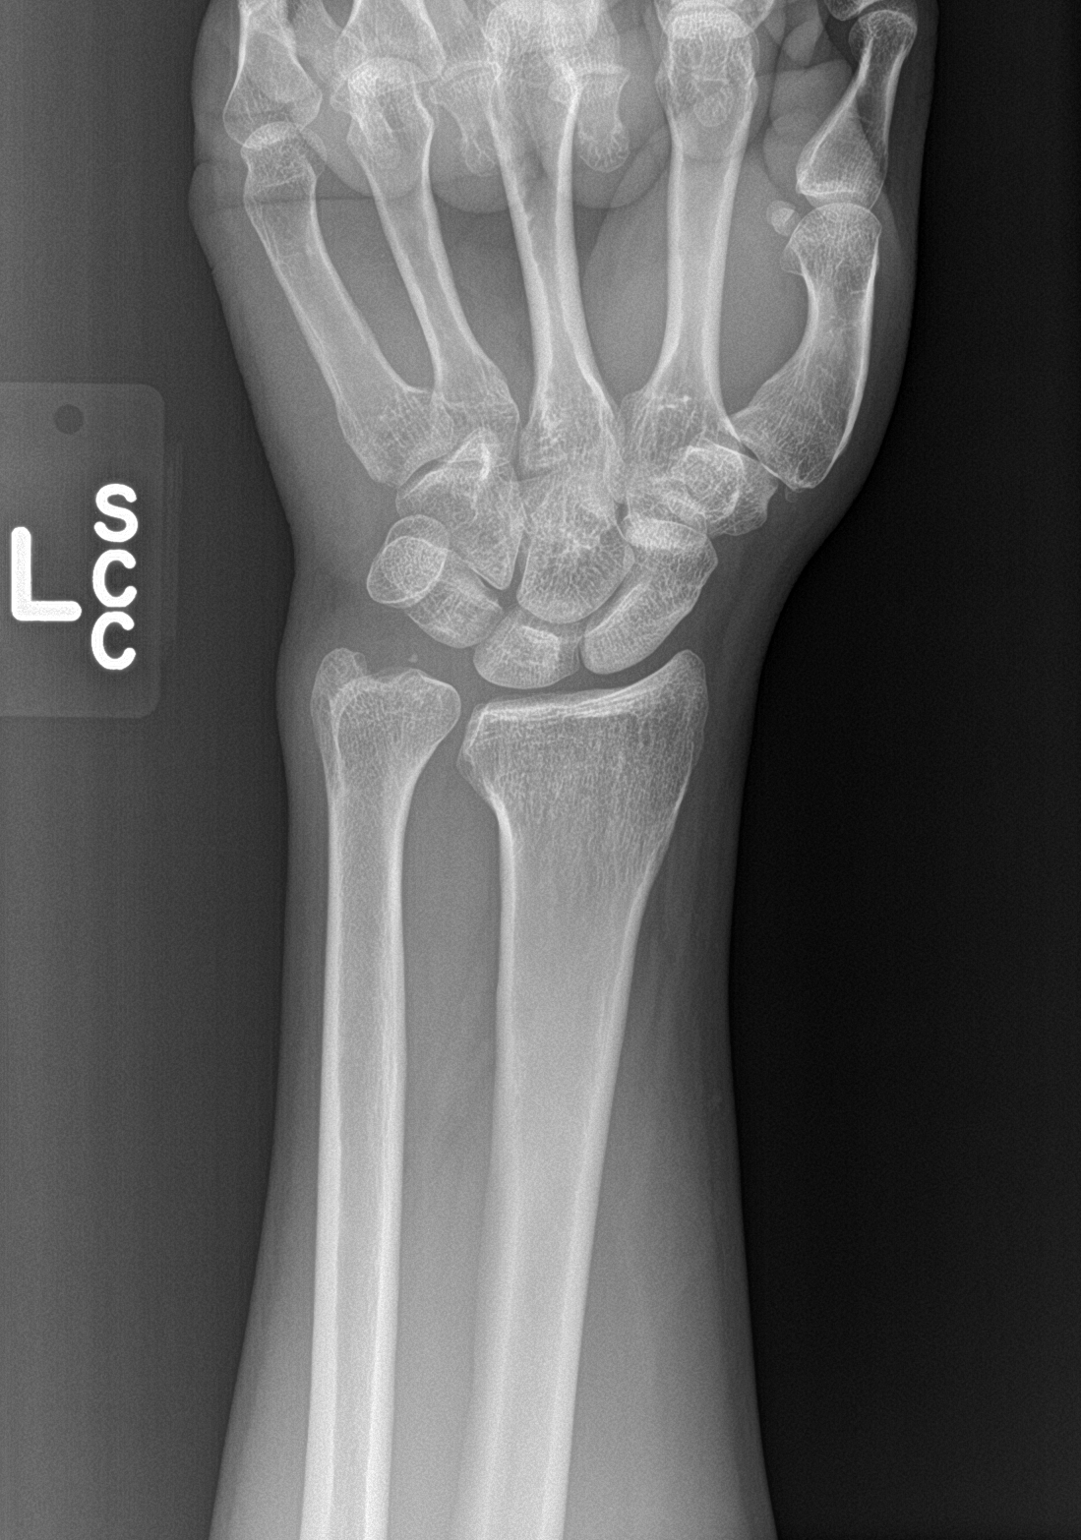

[wrist obl]
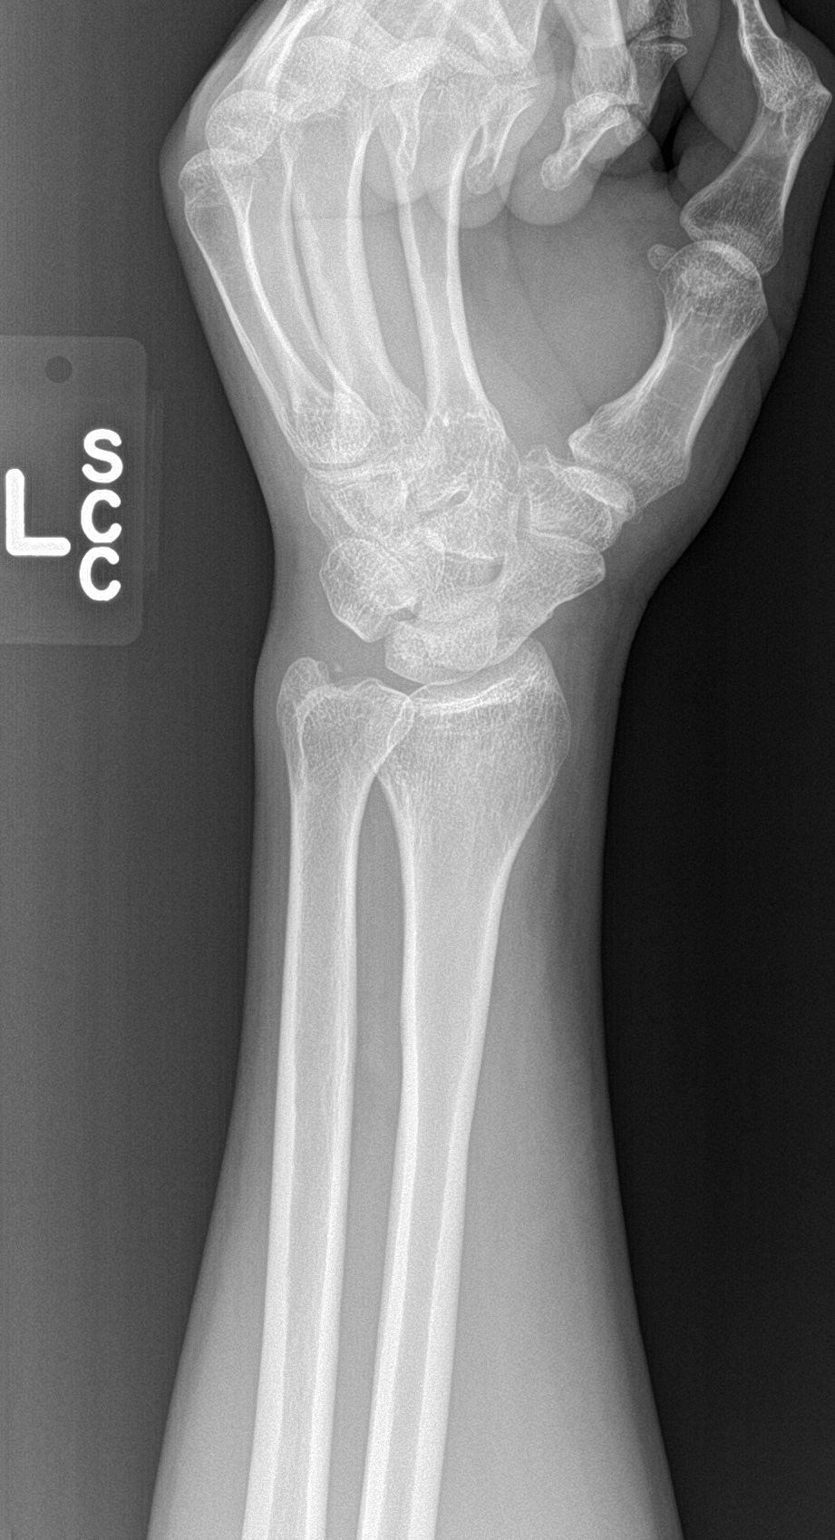

[wrist lat]
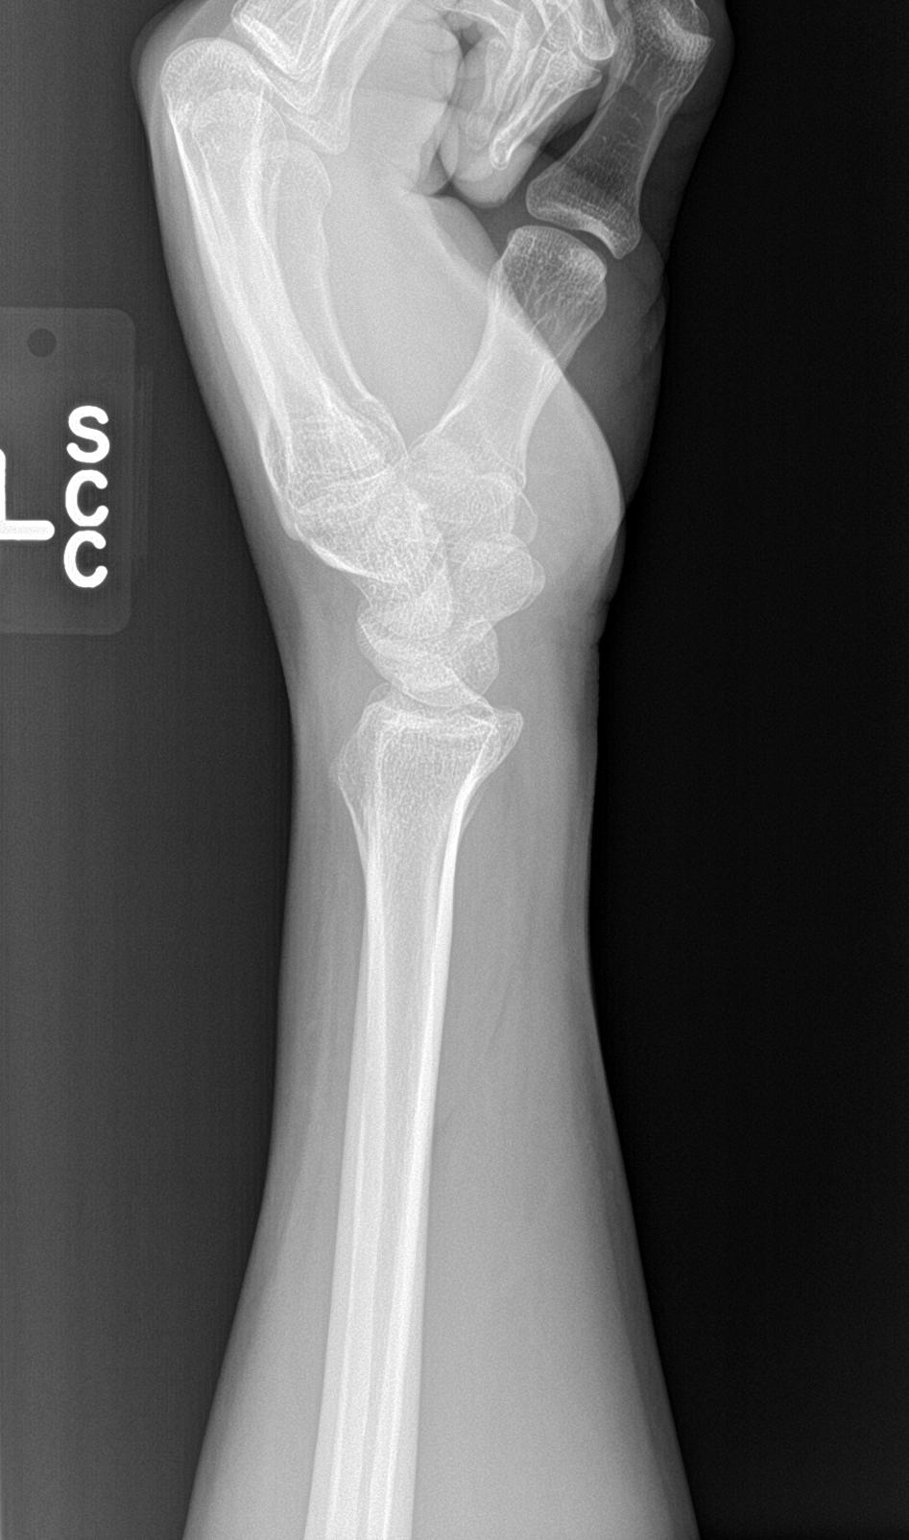

[wrist navicular]
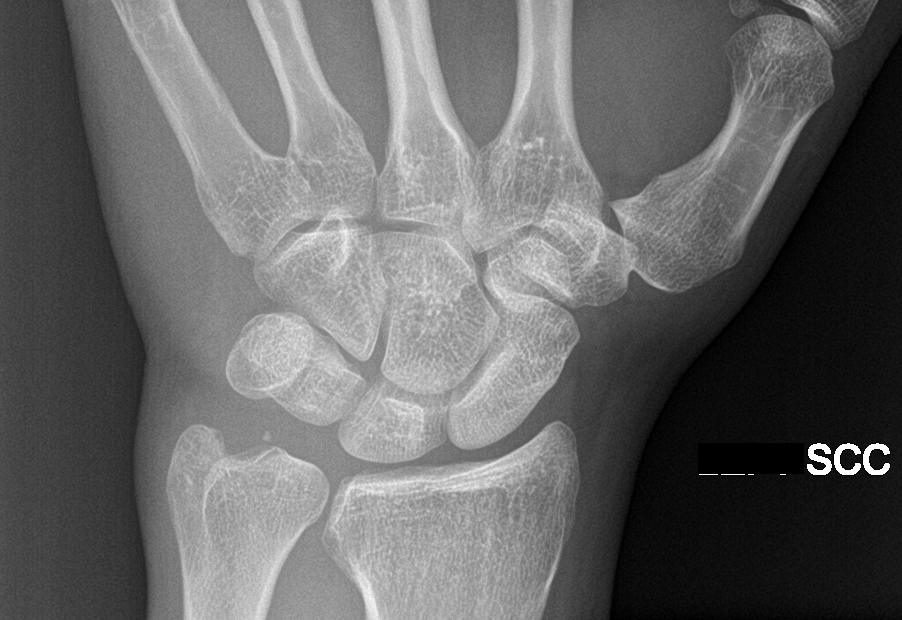

[4 of 4 positions shown; findings below may reference images not displayed]

FINDINGS: There is no evidence of fracture or dislocation. Mild ulna positive
variance. Tiny density just distal to the radial aspect of the ulna
is of uncertain significance but appears chronic. The scaphoid is
intact. No definite soft tissue abnormality defined
radiographically.
IMPRESSION: No acute fracture or subluxation of the left wrist.

Mild ulna positive variance.

## 2017-12-24 ENCOUNTER — Encounter (HOSPITAL_COMMUNITY): Payer: Self-pay | Admitting: Emergency Medicine

## 2017-12-24 ENCOUNTER — Emergency Department (HOSPITAL_COMMUNITY)
Admission: EM | Admit: 2017-12-24 | Discharge: 2017-12-24 | Disposition: A | Discharge status: Home / Self Care | Payer: BLUE CROSS/BLUE SHIELD | Attending: Emergency Medicine | Admitting: Emergency Medicine

## 2017-12-24 DIAGNOSIS — R1013 Epigastric pain: Secondary | ICD-10-CM | POA: Insufficient documentation

## 2017-12-24 LAB — URINALYSIS, ROUTINE W REFLEX MICROSCOPIC
BACTERIA UA: NONE SEEN
BILIRUBIN URINE: NEGATIVE
Glucose, UA: NEGATIVE mg/dL
Hgb urine dipstick: NEGATIVE
Ketones, ur: 5 mg/dL — AB
LEUKOCYTES UA: NEGATIVE
Nitrite: NEGATIVE
Protein, ur: 100 mg/dL — AB
Specific Gravity, Urine: 1.03 (ref 1.005–1.030)
pH: 7 (ref 5.0–8.0)

## 2017-12-24 LAB — COMPREHENSIVE METABOLIC PANEL
ALT: 19 U/L (ref 17–63)
AST: 20 U/L (ref 15–41)
Albumin: 4.6 g/dL (ref 3.5–5.0)
Alkaline Phosphatase: 51 U/L (ref 38–126)
Anion gap: 12 (ref 5–15)
BUN: 14 mg/dL (ref 6–20)
CHLORIDE: 103 mmol/L (ref 101–111)
CO2: 25 mmol/L (ref 22–32)
CREATININE: 0.9 mg/dL (ref 0.61–1.24)
Calcium: 9.7 mg/dL (ref 8.9–10.3)
GFR calc Af Amer: >60 mL/min (ref >60)
Glucose, Bld: 133 mg/dL — ABNORMAL HIGH (ref 65–99)
Potassium: 3.4 mmol/L — ABNORMAL LOW (ref 3.5–5.1)
SODIUM: 140 mmol/L (ref 135–145)
Total Bilirubin: 0.6 mg/dL (ref 0.3–1.2)
Total Protein: 7.5 g/dL (ref 6.5–8.1)

## 2017-12-24 LAB — CBC
HEMATOCRIT: 46 % (ref 39.0–52.0)
Hemoglobin: 16.3 g/dL (ref 13.0–17.0)
MCH: 31 pg (ref 26.0–34.0)
MCHC: 35.4 g/dL (ref 30.0–36.0)
MCV: 87.6 fL (ref 78.0–100.0)
Platelets: 270 10*3/uL (ref 150–400)
RBC: 5.25 MIL/uL (ref 4.22–5.81)
RDW: 12.8 % (ref 11.5–15.5)
WBC: 14.8 10*3/uL — AB (ref 4.0–10.5)

## 2017-12-24 LAB — LIPASE, BLOOD: LIPASE: 26 U/L (ref 11–51)

## 2017-12-24 MED ORDER — OXYCODONE-ACETAMINOPHEN 5-325 MG PO TABS
1.0000 | ORAL_TABLET | ORAL | Status: DC | PRN
  Administered 2017-12-24: 1 via ORAL
  Filled 2017-12-24: qty 1

## 2017-12-24 NOTE — ED Triage Notes (Signed)
Pt c/o abdominal pain and nausea x 1 day. Denies vomiting/diarrhea, pt denies urinary symptoms. Hx abdominal surgery as a child.

## 2017-12-24 NOTE — ED Provider Notes (Signed)
[facility] EMERGENCY DEPARTMENT Provider Note   CSN: [id number] Arrival date & time: 12/24/17  0236     History   Chief Complaint Chief Complaint  Patient presents with  . Abdominal Pain    HPI Jimmy Sanchez is a 37 y.o. male presenting to the ED with acute onset of epigastric/periumbilical abdominal pain that began yesterday evening following dinner.  Patient states he ate a very large meal, insisting of a full dinner followed by desert, [place] fries, and entire container of strawberries.  Reports assoc nausea. Denies vomiting, diarrhea, constipation, F/C, urinary sx. Last BM was last night and normal. Patient and his wife state that this frequently occurs only following a very large meal like he had last night.  She states he has been evaluated by gastroenterology in the past, with all normal workup.  He states he is currently asymptomatic, following the oxycodone he was given at 3:00AM in triage.  Reports he was seen by a specialist in the past for these abdominal pains with imaging and endoscopic testing done which were all normal. Past surgical hx includes appendectomy and "intestinal surgery" as an infant.  The history is provided by the patient and the spouse.    Past Medical History:  Diagnosis Date  . Medical history non-contributory     Patient Active Problem List   Diagnosis Date Noted  . Abdominal pain 07/25/2013    Past Surgical History:  Procedure Laterality Date  . ANKLE FRACTURE SURGERY    . APPENDECTOMY    . SMALL INTESTINE SURGERY     as an infant       Home Medications    Prior to Admission medications   Medication Sig Start Date End Date Taking? Authorizing Provider  HYDROcodone-acetaminophen (NORCO/VICODIN) 5-325 MG per tablet Take 1-2 tablets by mouth every 4 (four) hours as needed. 03/06/15   Marlon Pel, PA-C  ibuprofen (ADVIL,MOTRIN) 200 MG tablet Take 200 mg by mouth every 6 (six) hours as needed for moderate pain.     [provider]  predniSONE (DELTASONE) 20 MG tablet Take 2 tablets (40 mg total) by mouth daily. Patient not taking: Reported on 03/06/2015 07/22/14   Elvina Sidle, MD    Family History Family History  Problem Relation Age of Onset  . Heart disease Mother   . Other Neg Hx     Social History Social History   Tobacco Use  . Smoking status: Never Smoker  . Smokeless tobacco: Never Used  Substance Use Topics  . Alcohol use: Yes    Comment: occ  . Drug use: No     Allergies   Patient has no known allergies.   Review of Systems Review of Systems  Constitutional: Negative for appetite change, chills and fever.  Cardiovascular: Negative for chest pain.  Gastrointestinal: Positive for abdominal pain and nausea. Negative for constipation, diarrhea and vomiting.  Genitourinary: Negative for dysuria and frequency.  All other systems reviewed and are negative.    Physical Exam Updated Vital Signs BP 102/61   Pulse (!) 57   Temp 98.3 F (36.8 C) (Oral)   Resp 16   SpO2 97%   Physical Exam  Constitutional: He appears well-developed and well-nourished. He does not appear ill. No distress.  HENT:  Head: Normocephalic and atraumatic.  Mouth/Throat: Oropharynx is clear and moist.  Eyes: Conjunctivae are normal.  Cardiovascular: Normal rate, regular rhythm, normal heart sounds and intact distal pulses.  Pulmonary/Chest: Effort normal and breath sounds normal.  Abdominal:  Soft. Bowel sounds are normal. He exhibits no distension and no mass. There is no tenderness. There is no rigidity, no rebound, no guarding and negative Murphy's sign.  Old surgical scars superior to umbilicus, midline below umbilicus and RLQ.   Neurological: He is alert.  Skin: Skin is warm.  Psychiatric: He has a normal mood and affect. His behavior is normal.  Nursing note and vitals reviewed.    ED Treatments / Results  Labs (all labs ordered are listed, but only abnormal results are  displayed) Labs Reviewed  COMPREHENSIVE METABOLIC PANEL - Abnormal; Notable for the following components:      Result Value   Potassium 3.4 (*)    Glucose, Bld 133 (*)    All other components within normal limits  CBC - Abnormal; Notable for the following components:   WBC 14.8 (*)    All other components within normal limits  URINALYSIS, ROUTINE W REFLEX MICROSCOPIC - Abnormal; Notable for the following components:   Color, Urine AMBER (*)    Ketones, ur 5 (*)    Protein, ur 100 (*)    Squamous Epithelial / LPF 0-5 (*)    All other components within normal limits  LIPASE, BLOOD    EKG  EKG Interpretation None       Radiology No results found.  Procedures Procedures (including critical care time)  Medications Ordered in ED Medications  oxyCODONE-acetaminophen (PERCOCET/ROXICET) 5-325 MG per tablet 1 tablet (1 tablet Oral Given 12/24/17 0255)     Initial Impression / Assessment and Plan / ED Course  I have reviewed the triage vital signs and the nursing notes.  Pertinent labs & imaging results that were available during my care of the patient were reviewed by me and considered in my medical decision making (see chart for details).     Pt w mid abdominal pain that began last night following large meal. Pt currently asymptomatic after oxycodone given 8 hours ago in triage. Pt requesting food. Patient is nontoxic, nonseptic appearing, in no apparent distress.  Patient's pain and other symptoms adequately managed in emergency department. Labs, and vitals reviewed. Mild leukocytosis, remainder of labs unremarkable. Discussed workup with patient. Pt feeling well and ready to go home. recommended he establish PCP for further evaluation of intermittent abdominal pains. May benefit from outpatient RUQ ultrasound to rule out cholelithiasis if symptoms persist.  Patient does not meet the SIRS or Sepsis criteria.  No indication of bowel obstruction, bowel perforation, cholecystitis,  diverticulitis. Patient discharged home with symptomatic treatment.  Pt safe for discharge.  Discussed results, findings, treatment and follow up. Patient advised of return precautions. Patient verbalized understanding and agreed with plan.  Final Clinical Impressions(s) / ED Diagnoses   Final diagnoses:  Epigastric abdominal pain    ED Discharge Orders    None       Halston Fairclough, [place] N, PA-C 12/24/17 1111    Alvira MondaySchlossman, Erin, MD 12/25/17 1422

## 2017-12-24 NOTE — ED Notes (Signed)
Delay explained to pt.  

## 2017-12-24 NOTE — Discharge Instructions (Signed)
Please read instructions below. It is important to establish primary care to follow up on your intermittent symptoms of abdominal pain. You may benefit from an ultrasound of your abdomen to evaluate for gallstones, if your symptoms persist or become more frequent.  It is recommended to avoid very large meals if this is the cause of your abdominal pain. Eat smaller portions. You can take zantac or pepcid as needed for stomach upset. You can also take tylenol. Return to the ER for severe abdominal pain, fever, uncontrollable vomiting, or new or concerning symptoms.

## 2019-08-13 ENCOUNTER — Other Ambulatory Visit: Payer: Self-pay

## 2019-08-13 ENCOUNTER — Ambulatory Visit
Admission: EM | Admit: 2019-08-13 | Discharge: 2019-08-13 | Disposition: A | Discharge status: Home / Self Care | Payer: BC Managed Care – PPO | Attending: Emergency Medicine | Admitting: Emergency Medicine

## 2019-08-13 DIAGNOSIS — K122 Cellulitis and abscess of mouth: Secondary | ICD-10-CM | POA: Diagnosis present

## 2019-08-13 LAB — POCT RAPID STREP A (OFFICE): Rapid Strep A Screen: NEGATIVE

## 2019-08-13 MED ORDER — DEXAMETHASONE SODIUM PHOSPHATE 10 MG/ML IJ SOLN
10.0000 mg | Freq: Once | INTRAMUSCULAR | Status: AC
  Administered 2019-08-13: 10 mg via INTRAMUSCULAR

## 2019-08-13 MED ORDER — LIDOCAINE VISCOUS HCL 2 % MT SOLN: 15.0000 mL | OROMUCOSAL | 0 refills | Status: DC | PRN

## 2019-08-13 NOTE — Discharge Instructions (Addendum)
Strep culture pending, will call you if positive and treat if needed. May use viscous lidocaine (gargle, spit) for additional pain relief. May take ibuprofen over-the-counter as written for additional pain relief. Return for worsening throat pain, difficulty breathing, swallowing, choking, fever.

## 2019-08-13 NOTE — ED Triage Notes (Signed)
Pt presents to UC w/ c/o sore throat and slightly runny nose since this morning. Pt reports taking OTC medications. Pt reports uvula is swollen and back of throat is red. Denies coughing.

## 2019-08-13 NOTE — ED Provider Notes (Signed)
[facility]    CSN: [id number] Arrival date & time: 08/13/19  1438      History   Chief Complaint Chief Complaint  Patient presents with  . Sore Throat    HPI Jimmy Sanchez is a 38 y.o. male presenting with sore throat, rhinorrhea since this morning.  Patient has taken OTC cold medications with relief of rhinorrhea.  States that his uvula is swollen.  Denies history of strep, tonsillitis.  Has not had any difficulty breathing, choking, or swallowing.  Admits to recent [place], came back yesterday.  States his wife had an elevated temperature the other day, but is fine today without any other symptoms.  No known contact with COVID positive persons.   Past Medical History:  Diagnosis Date  . Medical history non-contributory     Patient Active Problem List   Diagnosis Date Noted  . Abdominal pain 07/25/2013    Past Surgical History:  Procedure Laterality Date  . ANKLE FRACTURE SURGERY    . APPENDECTOMY    . SMALL INTESTINE SURGERY     as an infant       Home Medications    Prior to Admission medications   Medication Sig Start Date End Date Taking? Authorizing Provider  ibuprofen (ADVIL,MOTRIN) 200 MG tablet Take 200 mg by mouth every 6 (six) hours as needed for moderate pain.    [provider]  lidocaine (XYLOCAINE) 2 % solution Use as directed 15 mLs in the mouth or throat as needed for mouth pain. 08/13/19   Hall-Potvin, [place], PA-C    Family History Family History  Problem Relation Age of Onset  . Heart disease Mother   . Other Neg Hx     Social History Social History   Tobacco Use  . Smoking status: Never Smoker  . Smokeless tobacco: Never Used  Substance Use Topics  . Alcohol use: Never    Frequency: Never    Comment: occ  . Drug use: No     Allergies   Patient has no known allergies.   Review of Systems Review of Systems  Constitutional: Negative for activity change,  appetite change, fatigue and fever.  HENT: Positive for sore throat. Negative for congestion, dental problem, ear pain, facial swelling, hearing loss, sinus pain, trouble swallowing and voice change.   Eyes: Negative for photophobia, pain and visual disturbance.  Respiratory: Negative for cough and shortness of breath.   Cardiovascular: Negative for chest pain and palpitations.  Gastrointestinal: Negative for abdominal pain, diarrhea and vomiting.  Musculoskeletal: Negative for arthralgias and myalgias.  Skin: Negative for rash and wound.  Neurological: Negative for dizziness, speech difficulty and headaches.  All other systems reviewed and are negative.    Physical Exam Triage Vital Signs ED Triage Vitals  Enc Vitals Group     BP      Pulse      Resp      Temp      Temp src      SpO2      Weight      Height      Head Circumference      Peak Flow      Pain Score      Pain Loc      Pain Edu?      Excl. in GC?    No data found.  Updated Vital Signs BP 105/61 (BP Location: Left Arm)   Pulse 66  Temp 98.5 F (36.9 C) (Oral)   Resp 16   SpO2 98%   Visual Acuity Right Eye Distance:   Left Eye Distance:   Bilateral Distance:    Right Eye Near:   Left Eye Near:    Bilateral Near:     Physical Exam Constitutional:      General: He is not in acute distress.    Appearance: He is normal weight. He is not toxic-appearing.  HENT:     Head: Normocephalic and atraumatic.     Right Ear: Tympanic membrane, ear canal and external ear normal.     Left Ear: Tympanic membrane, ear canal and external ear normal.     Nose: No nasal deformity, congestion or rhinorrhea.     Comments: Turbinates nonedematous bilaterally with pink mucosa    Mouth/Throat:     Mouth: Mucous membranes are moist.     Tongue: Tongue does not deviate from midline.     Pharynx: Oropharynx is clear. Uvula midline. Posterior oropharyngeal erythema and uvula swelling present.     Tonsils: No tonsillar  exudate or tonsillar abscesses. 1+ on the right. 1+ on the left.     Comments: No tonsillar hypertrophy or exudate Eyes:     General: No scleral icterus.    Conjunctiva/sclera: Conjunctivae normal.     Pupils: Pupils are equal, round, and reactive to light.  Neck:     Musculoskeletal: Normal range of motion and neck supple. No muscular tenderness.     Thyroid: No thyromegaly.     Comments: Trachea midline Cardiovascular:     Rate and Rhythm: Normal rate and regular rhythm.     Heart sounds: No gallop.   Pulmonary:     Effort: Pulmonary effort is normal. No respiratory distress.     Breath sounds: No wheezing or rales.  Lymphadenopathy:     Cervical: No cervical adenopathy.  Skin:    General: Skin is warm.     Capillary Refill: Capillary refill takes less than 2 seconds.     Coloration: Skin is not jaundiced or pale.  Neurological:     General: No focal deficit present.     Mental Status: He is alert and oriented to person, place, and time.  Psychiatric:        Mood and Affect: Mood normal.        Behavior: Behavior normal.      UC Treatments / Results  Labs (all labs ordered are listed, but only abnormal results are displayed) Labs Reviewed  POCT RAPID STREP A (OFFICE) - Normal  CULTURE, GROUP A STREP [facility])    EKG   Radiology No results found.  Procedures Procedures (including critical care time)  Medications Ordered in UC Medications  dexamethasone (DECADRON) injection 10 mg (10 mg Intramuscular Given 08/13/19 1516)    Initial Impression / Assessment and Plan / UC Course  I have reviewed the triage vital signs and the nursing notes.  Pertinent labs & imaging results that were available during my care of the patient were reviewed by me and considered in my medical decision making (see chart for details).     1.  Uvulitis Rapid strep done in office: Negative.  Culture pending.  Reviewed findings with patient who verbalized understanding.  IM Decadron given  in office which he tolerated well.  Low concern for airway obstruction at this time: We will treat with viscous lidocaine for pain relief, in conjunction with NSAID for anti-inflammatory shin.  Return precautions discussed, patient verbalized  understanding and is agreeable to plan. Final Clinical Impressions(s) / UC Diagnoses   Final diagnoses:  Uvulitis     Discharge Instructions     Strep culture pending, will call you if positive and treat if needed. May use viscous lidocaine (gargle, spit) for additional pain relief. May take ibuprofen over-the-counter as written for additional pain relief. Return for worsening throat pain, difficulty breathing, swallowing, choking, fever.    ED Prescriptions    Medication Sig Dispense Auth. Provider   lidocaine (XYLOCAINE) 2 % solution Use as directed 15 mLs in the mouth or throat as needed for mouth pain. 100 mL Hall-Potvin, [place], PA-C     Controlled Substance Prescriptions [place] Controlled Substance Registry consulted? Not Applicable   Quincy Sheehan, [place] 08/13/19 1535

## 2019-08-15 LAB — CULTURE, GROUP A STREP (THRC)

## 2022-08-23 ENCOUNTER — Encounter (HOSPITAL_COMMUNITY): Payer: Self-pay

## 2022-08-23 ENCOUNTER — Inpatient Hospital Stay (HOSPITAL_COMMUNITY)
Admission: EM | Admit: 2022-08-23 | Discharge: 2022-08-26 | DRG: 390 | Disposition: A | Discharge status: Home / Self Care | Payer: BC Managed Care – PPO | Attending: Family Medicine | Admitting: Family Medicine

## 2022-08-23 ENCOUNTER — Emergency Department (HOSPITAL_COMMUNITY): Payer: BC Managed Care – PPO

## 2022-08-23 ENCOUNTER — Other Ambulatory Visit: Payer: Self-pay

## 2022-08-23 DIAGNOSIS — K56609 Unspecified intestinal obstruction, unspecified as to partial versus complete obstruction: Secondary | ICD-10-CM | POA: Diagnosis present

## 2022-08-23 DIAGNOSIS — K56600 Partial intestinal obstruction, unspecified as to cause: Principal | ICD-10-CM | POA: Diagnosis present

## 2022-08-23 DIAGNOSIS — D72829 Elevated white blood cell count, unspecified: Secondary | ICD-10-CM | POA: Diagnosis present

## 2022-08-23 DIAGNOSIS — Z8249 Family history of ischemic heart disease and other diseases of the circulatory system: Secondary | ICD-10-CM

## 2022-08-23 DIAGNOSIS — K209 Esophagitis, unspecified without bleeding: Secondary | ICD-10-CM

## 2022-08-23 DIAGNOSIS — R1084 Generalized abdominal pain: Secondary | ICD-10-CM | POA: Diagnosis not present

## 2022-08-23 LAB — LIPASE, BLOOD: Lipase: 29 U/L (ref 11–51)

## 2022-08-23 LAB — URINALYSIS, ROUTINE W REFLEX MICROSCOPIC
Bacteria, UA: NONE SEEN
Bilirubin Urine: NEGATIVE
Glucose, UA: NEGATIVE mg/dL
Ketones, ur: 20 mg/dL — AB
Leukocytes,Ua: NEGATIVE
Nitrite: NEGATIVE
Protein, ur: 30 mg/dL — AB
Specific Gravity, Urine: 1.03 (ref 1.005–1.030)
pH: 5 (ref 5.0–8.0)

## 2022-08-23 LAB — COMPREHENSIVE METABOLIC PANEL
ALT: 14 U/L (ref 0–44)
AST: 18 U/L (ref 15–41)
Albumin: 4.5 g/dL (ref 3.5–5.0)
Alkaline Phosphatase: 48 U/L (ref 38–126)
Anion gap: 9 (ref 5–15)
BUN: 13 mg/dL (ref 6–20)
CO2: 23 mmol/L (ref 22–32)
Calcium: 9.7 mg/dL (ref 8.9–10.3)
Chloride: 108 mmol/L (ref 98–111)
Creatinine, Ser: 0.83 mg/dL (ref 0.61–1.24)
GFR, Estimated: >60 mL/min (ref >60)
Glucose, Bld: 129 mg/dL — ABNORMAL HIGH (ref 70–99)
Potassium: 3.6 mmol/L (ref 3.5–5.1)
Sodium: 140 mmol/L (ref 135–145)
Total Bilirubin: 0.2 mg/dL — ABNORMAL LOW (ref 0.3–1.2)
Total Protein: 8 g/dL (ref 6.5–8.1)

## 2022-08-23 LAB — CBC WITH DIFFERENTIAL/PLATELET
Abs Immature Granulocytes: 0.06 10*3/uL (ref 0.00–0.07)
Basophils Absolute: 0 10*3/uL (ref 0.0–0.1)
Basophils Relative: 0 %
Eosinophils Absolute: 0 10*3/uL (ref 0.0–0.5)
Eosinophils Relative: 0 %
HCT: 47.8 % (ref 39.0–52.0)
Hemoglobin: 16.9 g/dL (ref 13.0–17.0)
Immature Granulocytes: 0 %
Lymphocytes Relative: 5 %
Lymphs Abs: 0.8 10*3/uL (ref 0.7–4.0)
MCH: 31 pg (ref 26.0–34.0)
MCHC: 35.4 g/dL (ref 30.0–36.0)
MCV: 87.7 fL (ref 80.0–100.0)
Monocytes Absolute: 0.3 10*3/uL (ref 0.1–1.0)
Monocytes Relative: 2 %
Neutro Abs: 13.4 10*3/uL — ABNORMAL HIGH (ref 1.7–7.7)
Neutrophils Relative %: 93 %
Platelets: 297 10*3/uL (ref 150–400)
RBC: 5.45 MIL/uL (ref 4.22–5.81)
RDW: 13.1 % (ref 11.5–15.5)
WBC: 14.5 10*3/uL — ABNORMAL HIGH (ref 4.0–10.5)
nRBC: 0 % (ref 0.0–0.2)

## 2022-08-23 MED ORDER — SODIUM CHLORIDE 0.9 % IV BOLUS
1000.0000 mL | Freq: Once | INTRAVENOUS | Status: AC
  Administered 2022-08-23: 1000 mL via INTRAVENOUS

## 2022-08-23 MED ORDER — HYDROCODONE-ACETAMINOPHEN 5-325 MG PO TABS
1.0000 | ORAL_TABLET | Freq: Once | ORAL | Status: AC
  Administered 2022-08-23: 1 via ORAL
  Filled 2022-08-23: qty 1

## 2022-08-23 MED ORDER — SODIUM CHLORIDE 0.9 % IV SOLN: INTRAVENOUS | Status: DC

## 2022-08-23 MED ORDER — MORPHINE SULFATE (PF) 2 MG/ML IV SOLN: 1.0000 mg | INTRAVENOUS | Status: DC | PRN

## 2022-08-23 MED ORDER — ONDANSETRON HCL 4 MG/2ML IJ SOLN
4.0000 mg | Freq: Once | INTRAMUSCULAR | Status: AC
  Administered 2022-08-23: 4 mg via INTRAVENOUS
  Filled 2022-08-23: qty 2

## 2022-08-23 MED ORDER — MORPHINE SULFATE (PF) 4 MG/ML IV SOLN
4.0000 mg | Freq: Once | INTRAVENOUS | Status: AC
  Administered 2022-08-23: 4 mg via INTRAVENOUS
  Filled 2022-08-23: qty 1

## 2022-08-23 MED ORDER — IOHEXOL 350 MG/ML SOLN
75.0000 mL | Freq: Once | INTRAVENOUS | Status: AC | PRN
  Administered 2022-08-23: 75 mL via INTRAVENOUS

## 2022-08-23 MED ORDER — NALOXONE HCL 0.4 MG/ML IJ SOLN: 0.4000 mg | INTRAMUSCULAR | Status: DC | PRN

## 2022-08-23 NOTE — ED Provider Triage Note (Signed)
Emergency Medicine Provider Triage Evaluation Note  [facility] Judith Part , a 41 y.o. male  was evaluated in triage.  Pt complains of abdominal pain.  Started last night while asleep.  Aggressively worsened this morning.  Endorses abdominal distention and exquisite tenderness of his entire abdomen.  Also endorsing significant vomiting since last night.  History of appendectomy.  Significant other reports that he has the symptoms frequently.  Review of Systems  Positive: See above Negative: See above  Physical Exam  BP (!) 144/96   Pulse 92   Temp 97.8 F (36.6 C) (Oral)   Resp (!) 22   Ht 5\' 8"  (1.727 m)   Wt 86.2 kg   SpO2 100%   BMI 28.89 kg/m  Gen:   Awake, distress,  Resp:  Normal effort, tachypneic MSK:   Moves extremities without difficulty  Other:  Generalized exquisite tenderness of the abdomen.  No CVA tenderness  Medical Decision Making  Medically screening exam initiated at 1:49 PM.  Appropriate orders placed.  [facility] Judith Part was informed that the remainder of the evaluation will be completed by another provider, this initial triage assessment does not replace that evaluation, and the importance of remaining in the ED until their evaluation is complete.  Plan: abdominal labs, ct ab pelvis   Harriet Pho, PA-C 08/23/22 1956

## 2022-08-23 NOTE — Consult Note (Signed)
Consulting Physician: Hyman Hopes Ota Ebersole  Referring Provider: Dr. Particia Nearing, ER Provider  Chief Complaint: Abdominal pain  Reason for Consult: Bowel obstruction   Subjective   HPI: Jimmy Sanchez is an 41 y.o. male who is here for abdominal pain. The pain started in the mornign.  He ate too much last night.  This happens when he eats too much, but this time the pain didn't get better so he had to come to the ER.  He gagged himself to vomit, but did not vomit spontaneously.  He feels a little better since coming to the ER but tried to drink a little and felt nauseated again.  He had intestine surgery as an infant.  Past Medical History:  Diagnosis Date   Medical history non-contributory     Past Surgical History:  Procedure Laterality Date   ANKLE FRACTURE SURGERY     APPENDECTOMY     SMALL INTESTINE SURGERY     as an infant    Family History  Problem Relation Age of Onset   Heart disease Mother    Other Neg Hx     Social:  reports that he has never smoked. He has never used smokeless tobacco. He reports that he does not drink alcohol and does not use drugs.  Allergies: No Known Allergies  Medications: Current Outpatient Medications  Medication Instructions   ibuprofen (ADVIL) 200 mg, Oral, Every 6 hours PRN   lidocaine (XYLOCAINE) 2 % solution 15 mLs, Mouth/Throat, As needed    ROS - all of the below systems have been reviewed with the patient and positives are indicated with bold text General: chills, fever or night sweats Eyes: blurry vision or double vision ENT: epistaxis or sore throat Allergy/Immunology: itchy/watery eyes or nasal congestion Hematologic/Lymphatic: bleeding problems, blood clots or swollen lymph nodes Endocrine: temperature intolerance or unexpected weight changes Breast: new or changing breast lumps or nipple discharge Resp: cough, shortness of breath, or wheezing CV: chest pain or dyspnea on exertion GI: as per HPI GU:  dysuria, trouble voiding, or hematuria MSK: joint pain or joint stiffness Neuro: TIA or stroke symptoms Derm: pruritus and skin lesion changes Psych: anxiety and depression  Objective   PE Blood pressure 115/75, pulse 86, temperature 98 F (36.7 C), resp. rate 16, height 5\' 8"  (1.727 m), weight 86.2 kg, SpO2 97 %. Constitutional: NAD; conversant; no deformities Eyes: Moist conjunctiva; no lid lag; anicteric; PERRL Neck: Trachea midline; no thyromegaly Lungs: Normal respiratory effort; no tactile fremitus CV: RRR; no palpable thrills; no pitting edema GI: Abd , nontender, mild distention.; no palpable hepatosplenomegaly MSK: Normal range of motion of extremities; no clubbing/cyanosis Psychiatric: Appropriate affect; alert and oriented x3 Lymphatic: No palpable cervical or axillary lymphadenopathy  Results for orders placed or performed during the hospital encounter of 08/23/22 (from the past 24 hour(s))  CBC with Differential     Status: Abnormal   Collection Time: 08/23/22  1:36 PM  Result Value Ref Range   WBC 14.5 (H) 4.0 - 10.5 K/uL   RBC 5.45 4.22 - 5.81 MIL/uL   Hemoglobin 16.9 13.0 - 17.0 g/dL   HCT 08/25/22 03.2 - 12.2 %   MCV 87.7 80.0 - 100.0 fL   MCH 31.0 26.0 - 34.0 pg   MCHC 35.4 30.0 - 36.0 g/dL   RDW 48.2 50.0 - 37.0 %   Platelets 297 150 - 400 K/uL   nRBC 0.0 0.0 - 0.2 %   Neutrophils Relative % 93 %  Neutro Abs 13.4 (H) 1.7 - 7.7 K/uL   Lymphocytes Relative 5 %   Lymphs Abs 0.8 0.7 - 4.0 K/uL   Monocytes Relative 2 %   Monocytes Absolute 0.3 0.1 - 1.0 K/uL   Eosinophils Relative 0 %   Eosinophils Absolute 0.0 0.0 - 0.5 K/uL   Basophils Relative 0 %   Basophils Absolute 0.0 0.0 - 0.1 K/uL   Immature Granulocytes 0 %   Abs Immature Granulocytes 0.06 0.00 - 0.07 K/uL  Lipase, blood     Status: None   Collection Time: 08/23/22  1:37 PM  Result Value Ref Range   Lipase 29 11 - 51 U/L  Comprehensive metabolic panel     Status: Abnormal   Collection Time:  08/23/22  1:37 PM  Result Value Ref Range   Sodium 140 135 - 145 mmol/L   Potassium 3.6 3.5 - 5.1 mmol/L   Chloride 108 98 - 111 mmol/L   CO2 23 22 - 32 mmol/L   Glucose, Bld 129 (H) 70 - 99 mg/dL   BUN 13 6 - 20 mg/dL   Creatinine, Ser 0.83 0.61 - 1.24 mg/dL   Calcium 9.7 8.9 - 10.3 mg/dL   Total Protein 8.0 6.5 - 8.1 g/dL   Albumin 4.5 3.5 - 5.0 g/dL   AST 18 15 - 41 U/L   ALT 14 0 - 44 U/L   Alkaline Phosphatase 48 38 - 126 U/L   Total Bilirubin 0.2 (L) 0.3 - 1.2 mg/dL   GFR, Estimated >60 >60 mL/min   Anion gap 9 5 - 15  Urinalysis, Routine w reflex microscopic     Status: Abnormal   Collection Time: 08/23/22  2:18 PM  Result Value Ref Range   Color, Urine AMBER (A) YELLOW   APPearance CLOUDY (A) CLEAR   Specific Gravity, Urine 1.030 1.005 - 1.030   pH 5.0 5.0 - 8.0   Glucose, UA NEGATIVE NEGATIVE mg/dL   Hgb urine dipstick SMALL (A) NEGATIVE   Bilirubin Urine NEGATIVE NEGATIVE   Ketones, ur 20 (A) NEGATIVE mg/dL   Protein, ur 30 (A) NEGATIVE mg/dL   Nitrite NEGATIVE NEGATIVE   Leukocytes,Ua NEGATIVE NEGATIVE   RBC / HPF 11-20 0 - 5 RBC/hpf   WBC, UA 0-5 0 - 5 WBC/hpf   Bacteria, UA NONE SEEN NONE SEEN   Mucus PRESENT      Imaging Orders         CT Abdomen Pelvis W Contrast    Multiple loops of dilated and fluid-filled small bowel with transition point in the right lower quadrant, suspicious for early or partial small-bowel obstruction versus small bowel enteritis. Marked distension of the stomach.   Distal esophageal wall thickening which may suggest esophagitis.   Trace free fluid in the pelvis.  Assessment and Plan   [name] is an 41 y.o. male with a small bowel obstruction.  He is feeling okay without a NG tube, I would recommend placing one if he worsens clinically.  The obstruction is likely adhesive from surgery he had in his youth.  Hopefully, it will resolve with bowel rest and conservative management.     ICD-10-CM   1. SBO  (small bowel obstruction) ([place])  K56.609        Felicie Morn, MD  [facility] Use [email or web address] to contact on call provider  New Patient Billing: [phone] - Moderate MDM

## 2022-08-23 NOTE — H&P (Signed)
History and Physical    Jimmy Sanchez [id number] DOB: 1980/12/29 DOA: 08/23/2022  PCP: Patient, No Pcp Per  Patient coming from: Home  Chief Complaint: Abdominal pain  HPI: Jimmy Sanchez is a 41 y.o. male with medical history significant of appendectomy and small intestine surgery as an infant presenting with a complaints of abdominal pain and distention.  History provided by patient and his wife at bedside.  Yesterday he had some spicy snacks in the afternoon and later had dinner in the evening.  Around 1:30 in the AM he woke up with severe generalized abdominal pain.  He also had nausea and vomiting at home.  Patient states he now feels much better after receiving medications in the ED.  States he felt nauseous while trying to drink some water in the ED but did not vomit.  Reports having a regular bowel movement this morning.  He reports history of 2 prior abdominal surgeries.  One of his abdominal surgeries was done when he was an infant and he had another surgery done to remove his appendix several years ago.  Reports history of bowel obstructions 3 times in the past and wife is concerned this happens every times after the patient eats large amounts of food at a time.  Patient has no other complaints.  Denies fevers, cough, shortness of breath, or chest pain.  ED course: Afebrile.  WBC 14.5, lipase normal, no elevation of LFTs, UA without signs of infection.  CT abdomen pelvis showing multiple loops of dilated and fluid-filled small bowel with transition point in the right lower quadrant suspicious for early or partial SBO versus small bowel enteritis.  Marked distention of the stomach.  Distal esophageal thickening which may suggest esophagitis.  Patient was given Norco, morphine, Zofran, and 1 L normal saline bolus.  General surgery consulted and recommended holding off NG tube until patient is seen by their team.  Review of Systems:  ROS  Past Medical History:   Diagnosis Date   Medical history non-contributory     Past Surgical History:  Procedure Laterality Date   ANKLE FRACTURE SURGERY     APPENDECTOMY     SMALL INTESTINE SURGERY     as an infant     reports that he has never smoked. He has never used smokeless tobacco. He reports that he does not drink alcohol and does not use drugs.  No Known Allergies  Family History  Problem Relation Age of Onset   Heart disease Mother    Other Neg Hx     Prior to Admission medications   Medication Sig Start Date End Date Taking? Authorizing Provider  ibuprofen (ADVIL,MOTRIN) 200 MG tablet Take 200 mg by mouth every 6 (six) hours as needed for moderate pain.    [provider]  lidocaine (XYLOCAINE) 2 % solution Use as directed 15 mLs in the mouth or throat as needed for mouth pain. 08/13/19   Hall-Potvin, [place], [place]    Physical Exam: Vitals:   08/23/22 2030 08/23/22 2054 08/23/22 2100 08/23/22 2130  BP: 130/84  127/79 115/75  Pulse: 87  88 86  Resp: 18  15 16   Temp:  98 F (36.7 C)    TempSrc:      SpO2: 98%  97% 97%  Weight:      Height:        Physical Exam   Labs on Admission: I have personally reviewed following labs and imaging studies  CBC: Recent Labs  Lab  08/23/22 1336  WBC 14.5*  NEUTROABS 13.4*  HGB 16.9  HCT 47.8  MCV 87.7  PLT [id number]   Basic Metabolic Panel: Recent Labs  Lab 08/23/22 1337  NA 140  K 3.6  CL 108  CO2 23  GLUCOSE 129*  BUN 13  CREATININE 0.83  CALCIUM 9.7   GFR: Estimated Creatinine Clearance: 122.5 mL/min (by C-G formula based on SCr of 0.83 mg/dL). Liver Function Tests: Recent Labs  Lab 08/23/22 1337  AST 18  ALT 14  ALKPHOS 48  BILITOT 0.2*  PROT 8.0  ALBUMIN 4.5   Recent Labs  Lab 08/23/22 1337  LIPASE 29   No results for input(s): "AMMONIA" in the last 168 hours. Coagulation Profile: No results for input(s): "INR", "PROTIME" in the last 168 hours. Cardiac Enzymes: No results for input(s): "CKTOTAL",  "CKMB", "CKMBINDEX", "TROPONINI" in the last 168 hours. BNP (last 3 results) No results for input(s): "PROBNP" in the last 8760 hours. HbA1C: No results for input(s): "HGBA1C" in the last 72 hours. CBG: No results for input(s): "GLUCAP" in the last 168 hours. Lipid Profile: No results for input(s): "CHOL", "HDL", "LDLCALC", "TRIG", "CHOLHDL", "LDLDIRECT" in the last 72 hours. Thyroid Function Tests: No results for input(s): "TSH", "T4TOTAL", "FREET4", "T3FREE", "THYROIDAB" in the last 72 hours. Anemia Panel: No results for input(s): "VITAMINB12", "FOLATE", "FERRITIN", "TIBC", "IRON", "RETICCTPCT" in the last 72 hours. Urine analysis:    Component Value Date/Time   COLORURINE AMBER (A) 08/23/2022 1418   APPEARANCEUR CLOUDY (A) 08/23/2022 1418   LABSPEC 1.030 08/23/2022 1418   PHURINE 5.0 08/23/2022 1418   GLUCOSEU NEGATIVE 08/23/2022 1418   HGBUR SMALL (A) 08/23/2022 1418   BILIRUBINUR NEGATIVE 08/23/2022 1418   BILIRUBINUR neg 09/12/2012 1246   KETONESUR 20 (A) 08/23/2022 1418   PROTEINUR 30 (A) 08/23/2022 1418   UROBILINOGEN 0.2 07/25/2013 0147   NITRITE NEGATIVE 08/23/2022 1418   LEUKOCYTESUR NEGATIVE 08/23/2022 1418    Radiological Exams on Admission: CT Abdomen Pelvis W Contrast  Result Date: 08/23/2022 CLINICAL DATA:  Abdominal pain, acute, nonlocalized EXAM: CT ABDOMEN AND PELVIS WITH CONTRAST TECHNIQUE: Multidetector CT imaging of the abdomen and pelvis was performed using the standard protocol following bolus administration of intravenous contrast. RADIATION DOSE REDUCTION: This exam was performed according to the departmental dose-optimization program which includes automated exposure control, adjustment of the mA and/or kV according to patient size and/or use of iterative reconstruction technique. CONTRAST:  57mL OMNIPAQUE IOHEXOL 350 MG/ML SOLN COMPARISON:  CT abdomen pelvis 07/25/2013 FINDINGS: Lower chest: Bibasilar hypoventilatory change. Hepatobiliary: There are few  hypodense liver lesions consistent with small cysts. The gallbladder is unremarkable. Pancreas: Unremarkable. No pancreatic ductal dilatation or surrounding inflammatory changes. Spleen: Normal in size without focal abnormality. Adrenals/Urinary Tract: Adrenal glands are unremarkable. No hydronephrosis or nephrolithiasis. Bladder is minimally distended. Stomach/Bowel: Mild distal esophageal wall thickening. Marked distension of the stomach. Decompressed duodenum and proximal small bowel. Multiple loops of dilated and fluid-filled small bowel with areas of mild mesenteric edema. There is decompression of the distal small bowel with transition point identified in the right lower quadrant (series 3, image 65). The colon is decompressed. Prior appendectomy. Vascular/Lymphatic: No significant vascular findings are present. No enlarged abdominal or pelvic lymph nodes. Reproductive: Unremarkable. Other: No abdominal wall hernia.  Trace free fluid in the pelvis. Musculoskeletal: No acute or significant osseous findings. IMPRESSION: Multiple loops of dilated and fluid-filled small bowel with transition point in the right lower quadrant, suspicious for early or partial small-bowel obstruction versus small  bowel enteritis. Marked distension of the stomach. Distal esophageal wall thickening which may suggest esophagitis. Trace free fluid in the pelvis. Electronically Signed   By: Maurine Simmering M.D.   On: 08/23/2022 16:12    EKG: Ordered and currently pending.  Assessment and Plan  Early or partial SBO versus small bowel enteritis Patient presenting with complaints of generalized abdominal pain abdominal distention, nausea, and vomiting.  His last bowel movement was today.  History of 2 prior abdominal surgeries.  CT abdomen pelvis showing multiple loops of dilated and fluid-filled small bowel with transition point in the right lower quadrant suspicious for early or partial SBO versus small bowel enteritis.  Marked  distention of the stomach.  Has mild leukocytosis but no fever or signs of sepsis.  His symptoms have improved after receiving analgesics and antiemetic in the ED.  He is not actively vomiting. General surgery consulted and recommended holding off NG tube until patient is seen by their team. -Keep n.p.o./bowel rest -IV fluid hydration -Pain management -Antiemetic as needed, EKG ordered to check QT interval -Monitor electrolytes and WBC count  Possible esophagitis CT showing distal esophageal thickening. -Start PPI  DVT prophylaxis: SCDs Code Status: Full Code Family Communication: Wife at bedside. Consults called: General surgery (Dr. Thermon Leyland) Level of care: Med-Surg Admission status: It is my clinical opinion that referral for OBSERVATION is reasonable and necessary in this patient based on the above information provided. The aforementioned taken together are felt to place the patient at high risk for further clinical deterioration. However, it is anticipated that the patient may be medically stable for discharge from the hospital within 24 to 48 hours.   Shela Leff MD [facility]  If 7PM-7AM, please contact night-coverage [email or web address]  08/23/2022, 9:58 PM

## 2022-08-23 NOTE — ED Notes (Signed)
Patient complaining of abd pain, patient made aware that this tech can't give medication, but triage nurse will see him soon.

## 2022-08-23 NOTE — ED Triage Notes (Signed)
Pt c/o abdominal pain and vomiting since yesterday.

## 2022-08-23 NOTE — ED Provider Notes (Signed)
[facility] EMERGENCY DEPARTMENT Provider Note   CSN: [id number] Arrival date & time: 08/23/22  1155     History  Chief Complaint  Patient presents with   Abdominal Pain    Jimmy Sanchez is a 41 y.o. male.  Pt is a 41 yo male with pmhx significant for SBO.  Pt said he ate something spicy last night and has had pain and vomiting every since.  Pt feels like his abdomen is swollen.  No f/c.         Home Medications Prior to Admission medications   Medication Sig Start Date End Date Taking? Authorizing Provider  ibuprofen (ADVIL,MOTRIN) 200 MG tablet Take 200 mg by mouth every 6 (six) hours as needed for moderate pain.    [provider]  lidocaine (XYLOCAINE) 2 % solution Use as directed 15 mLs in the mouth or throat as needed for mouth pain. 08/13/19   Hall-Potvin, [place], PA-C      Allergies    Patient has no known allergies.    Review of Systems   Review of Systems  Gastrointestinal:  Positive for abdominal pain, nausea and vomiting.  All other systems reviewed and are negative.   Physical Exam Updated Vital Signs BP 130/84   Pulse 87   Temp 98 F (36.7 C)   Resp 18   Ht 5\' 8"  (1.727 m)   Wt 86.2 kg   SpO2 98%   BMI 28.89 kg/m  Physical Exam Vitals and nursing note reviewed.  Constitutional:      Appearance: He is well-developed.  HENT:     Head: Normocephalic and atraumatic.     Mouth/Throat:     Mouth: Mucous membranes are moist.     Pharynx: Oropharynx is clear.  Eyes:     Extraocular Movements: Extraocular movements intact.     Pupils: Pupils are equal, round, and reactive to light.  Cardiovascular:     Rate and Rhythm: Normal rate and regular rhythm.  Pulmonary:     Effort: Pulmonary effort is normal.     Breath sounds: Normal breath sounds.  Abdominal:     General: Abdomen is flat. Bowel sounds are normal.     Palpations: Abdomen is soft.     Tenderness: There is generalized abdominal tenderness.      Comments: Multiple scars on abd wall (hx unkn surgery as a baby)  Skin:    General: Skin is warm.     Capillary Refill: Capillary refill takes less than 2 seconds.  Neurological:     General: No focal deficit present.     Mental Status: He is alert and oriented to person, place, and time.  Psychiatric:        Mood and Affect: Mood normal.        Behavior: Behavior normal.     ED Results / Procedures / Treatments   Labs (all labs ordered are listed, but only abnormal results are displayed) Labs Reviewed  COMPREHENSIVE METABOLIC PANEL - Abnormal; Notable for the following components:      Result Value   Glucose, Bld 129 (*)    Total Bilirubin 0.2 (*)    All other components within normal limits  URINALYSIS, ROUTINE W REFLEX MICROSCOPIC - Abnormal; Notable for the following components:   Color, Urine AMBER (*)    APPearance CLOUDY (*)    Hgb urine dipstick SMALL (*)    Ketones, ur 20 (*)    Protein, ur 30 (*)  All other components within normal limits  CBC WITH DIFFERENTIAL/PLATELET - Abnormal; Notable for the following components:   WBC 14.5 (*)    Neutro Abs 13.4 (*)    All other components within normal limits  LIPASE, BLOOD    EKG None  Radiology CT Abdomen Pelvis W Contrast  Result Date: 08/23/2022 CLINICAL DATA:  Abdominal pain, acute, nonlocalized EXAM: CT ABDOMEN AND PELVIS WITH CONTRAST TECHNIQUE: Multidetector CT imaging of the abdomen and pelvis was performed using the standard protocol following bolus administration of intravenous contrast. RADIATION DOSE REDUCTION: This exam was performed according to the departmental dose-optimization program which includes automated exposure control, adjustment of the mA and/or kV according to patient size and/or use of iterative reconstruction technique. CONTRAST:  36mL OMNIPAQUE IOHEXOL 350 MG/ML SOLN COMPARISON:  CT abdomen pelvis 07/25/2013 FINDINGS: Lower chest: Bibasilar hypoventilatory change. Hepatobiliary: There are  few hypodense liver lesions consistent with small cysts. The gallbladder is unremarkable. Pancreas: Unremarkable. No pancreatic ductal dilatation or surrounding inflammatory changes. Spleen: Normal in size without focal abnormality. Adrenals/Urinary Tract: Adrenal glands are unremarkable. No hydronephrosis or nephrolithiasis. Bladder is minimally distended. Stomach/Bowel: Mild distal esophageal wall thickening. Marked distension of the stomach. Decompressed duodenum and proximal small bowel. Multiple loops of dilated and fluid-filled small bowel with areas of mild mesenteric edema. There is decompression of the distal small bowel with transition point identified in the right lower quadrant (series 3, image 65). The colon is decompressed. Prior appendectomy. Vascular/Lymphatic: No significant vascular findings are present. No enlarged abdominal or pelvic lymph nodes. Reproductive: Unremarkable. Other: No abdominal wall hernia.  Trace free fluid in the pelvis. Musculoskeletal: No acute or significant osseous findings. IMPRESSION: Multiple loops of dilated and fluid-filled small bowel with transition point in the right lower quadrant, suspicious for early or partial small-bowel obstruction versus small bowel enteritis. Marked distension of the stomach. Distal esophageal wall thickening which may suggest esophagitis. Trace free fluid in the pelvis. Electronically Signed   By: Maurine Simmering M.D.   On: 08/23/2022 16:12    Procedures Procedures    Medications Ordered in ED Medications  HYDROcodone-acetaminophen (NORCO/VICODIN) 5-325 MG per tablet 1 tablet (1 tablet Oral Given 08/23/22 1359)  iohexol (OMNIPAQUE) 350 MG/ML injection 75 mL (75 mLs Intravenous Contrast Given 08/23/22 1543)  sodium chloride 0.9 % bolus 1,000 mL (1,000 mLs Intravenous New Bag/Given 08/23/22 1925)  morphine (PF) 4 MG/ML injection 4 mg (4 mg Intravenous Given 08/23/22 1919)  ondansetron (ZOFRAN) injection 4 mg (4 mg Intravenous Given 08/23/22  1916)    ED Course/ Medical Decision Making/ A&P                           Medical Decision Making Amount and/or Complexity of Data Reviewed Labs: ordered.  Risk Prescription drug management. Decision regarding hospitalization.   This patient presents to the ED for concern of abd pain, this involves an extensive number of treatment options, and is a complaint that carries with it a high risk of complications and morbidity.  The differential diagnosis includes gastritis, cholelithiasis, pancreatitis, sbo   Co morbidities that complicate the patient evaluation  Hx sbo   Additional history obtained:  Additional history obtained from epic chart review External records from outside source obtained and reviewed including s.o.   Lab Tests:  I Ordered, and personally interpreted labs.  The pertinent results include:  cmp nl, ua + ketones, cbc with wbc elevated at 14.5; lip nl   Imaging Studies  ordered:  I ordered imaging studies including ct abd/pelvis  I independently visualized and interpreted imaging which showed  CT: IMPRESSION:  Multiple loops of dilated and fluid-filled small bowel with  transition point in the right lower quadrant, suspicious for early  or partial small-bowel obstruction versus small bowel enteritis.  Marked distension of the stomach.    Distal esophageal wall thickening which may suggest esophagitis.    Trace free fluid in the pelvis.   I agree with the radiologist interpretation   Cardiac Monitoring:  The patient was maintained on a cardiac monitor.  I personally viewed and interpreted the cardiac monitored which showed an underlying rhythm of: nsr   Medicines ordered and prescription drug management:  I ordered medication including morphine and zofran  for pain and nausea; ivfs for dehydration  Reevaluation of the patient after these medicines showed that the patient improved I have reviewed the patients home medicines and have made  adjustments as needed   Test Considered:  ct   Critical Interventions:  Pain control   Consultations Obtained:  I requested consultation with the gen surgeon (Dr. Thermon Leyland),  and discussed lab and imaging findings as well as pertinent plan - he will consult.   Pt d/w Dr. Marlowe Sax ([facility]) for admission.   Problem List / ED Course:  Partial SBO:  pt is unable to tolerate po fluids.  Pt tried to drink water and it made him nauseous.  No vomiting, but pain came back.  Due to this, I think he needs to come in.   Reevaluation:  After the interventions noted above, I reevaluated the patient and found that they have :improved   Social Determinants of Health:  No insurance; no pcp   Dispostion:  After consideration of the diagnostic results and the patients response to treatment, I feel that the patent would benefit from admission.          Final Clinical Impression(s) / ED Diagnoses Final diagnoses:  SBO (small bowel obstruction) ([place])    Rx / DC Orders ED Discharge Orders     None         Isla Pence, MD 08/23/22 2121

## 2022-08-24 DIAGNOSIS — K56600 Partial intestinal obstruction, unspecified as to cause: Secondary | ICD-10-CM | POA: Diagnosis present

## 2022-08-24 DIAGNOSIS — K56609 Unspecified intestinal obstruction, unspecified as to partial versus complete obstruction: Secondary | ICD-10-CM | POA: Diagnosis not present

## 2022-08-24 DIAGNOSIS — K209 Esophagitis, unspecified without bleeding: Secondary | ICD-10-CM | POA: Diagnosis present

## 2022-08-24 DIAGNOSIS — Z8249 Family history of ischemic heart disease and other diseases of the circulatory system: Secondary | ICD-10-CM | POA: Diagnosis not present

## 2022-08-24 DIAGNOSIS — R1084 Generalized abdominal pain: Secondary | ICD-10-CM | POA: Diagnosis present

## 2022-08-24 DIAGNOSIS — D72829 Elevated white blood cell count, unspecified: Secondary | ICD-10-CM | POA: Diagnosis present

## 2022-08-24 LAB — BASIC METABOLIC PANEL WITH GFR
Anion gap: 8 (ref 5–15)
BUN: 12 mg/dL (ref 6–20)
CO2: 23 mmol/L (ref 22–32)
Calcium: 8.7 mg/dL — ABNORMAL LOW (ref 8.9–10.3)
Chloride: 108 mmol/L (ref 98–111)
Creatinine, Ser: 0.79 mg/dL (ref 0.61–1.24)
GFR, Estimated: >60 mL/min (ref >60)
Glucose, Bld: 113 mg/dL — ABNORMAL HIGH (ref 70–99)
Potassium: 3.5 mmol/L (ref 3.5–5.1)
Sodium: 139 mmol/L (ref 135–145)

## 2022-08-24 LAB — CBC
HCT: 42.5 % (ref 39.0–52.0)
Hemoglobin: 14.8 g/dL (ref 13.0–17.0)
MCH: 30.7 pg (ref 26.0–34.0)
MCHC: 34.8 g/dL (ref 30.0–36.0)
MCV: 88.2 fL (ref 80.0–100.0)
Platelets: 257 10*3/uL (ref 150–400)
RBC: 4.82 MIL/uL (ref 4.22–5.81)
RDW: 13.2 % (ref 11.5–15.5)
WBC: 12.3 10*3/uL — ABNORMAL HIGH (ref 4.0–10.5)
nRBC: 0 % (ref 0.0–0.2)

## 2022-08-24 LAB — MAGNESIUM: Magnesium: 2 mg/dL (ref 1.7–2.4)

## 2022-08-24 LAB — HIV ANTIBODY (ROUTINE TESTING W REFLEX): HIV Screen 4th Generation wRfx: NONREACTIVE

## 2022-08-24 MED ORDER — POTASSIUM CHLORIDE 10 MEQ/100ML IV SOLN: 10.0000 meq | INTRAVENOUS | Status: DC

## 2022-08-24 MED ORDER — SODIUM CHLORIDE 0.9 % IV SOLN: INTRAVENOUS | Status: AC

## 2022-08-24 MED ORDER — ONDANSETRON HCL 4 MG/2ML IJ SOLN: 4.0000 mg | Freq: Four times a day (QID) | INTRAMUSCULAR | Status: DC | PRN

## 2022-08-24 MED ORDER — ORAL CARE MOUTH RINSE: 15.0000 mL | OROMUCOSAL | Status: DC | PRN

## 2022-08-24 MED ORDER — POTASSIUM CHLORIDE 10 MEQ/100ML IV SOLN
10.0000 meq | INTRAVENOUS | Status: AC
  Administered 2022-08-24 (×3): 10 meq via INTRAVENOUS
  Filled 2022-08-24 (×3): qty 100

## 2022-08-24 NOTE — ED Notes (Signed)
Patient denies pain and is resting comfortably.  

## 2022-08-24 NOTE — Progress Notes (Signed)
[facility] PROGRESS NOTE    Progress Note  Jimmy Sanchez  KKX:[id number] DOB: 06/28/1981 DOA: 08/23/2022 PCP: Patient, No Pcp Per     Brief Narrative:   Jimmy Sanchez is an 41 y.o. male past medical history significant for appendectomy, small intestine surgery as an infant, history of bowel obstruction x3 comes in complaining of abdominal pain and distention woke up at 1 AM on the day of admission with severe abdominal pain nausea and vomiting, white count of 15, CT scan of the abdomen pelvis showed dilated loops with a transition point in the right lower quadrant suspicious for partial small bowel obstruction.   Assessment/Plan:   Partial small bowel obstruction (HCC) CT scan of the abdomen pelvis as below. General surgery has been consulted recommending to hold on NG tube and continue conservative management Keep the patient n.p.o., continue IV fluid hydration limit analgesics for pain. Continue Zofran for nausea and vomiting. Has not passed gas has not had a bowel movement. Further management per surgery. Try to keep potassium greater than 4 magnesium greater than 2.  Possible esophagitis CT scan of the abdomen pelvis showed distal esophageal thickening. Started on a PPI. Currently NPO.  Leukocytosis: Likely reactive has remained afebrile white blood cell count is down.   DVT prophylaxis: lovenox Family Communication:none Status is: Observation The patient will require care spanning > 2 midnights and should be moved to inpatient because: Acute small bowel obstruction    Code Status:     Code Status Orders  (From admission, onward)           Start     Ordered   08/23/22 2212  Full code  Continuous        08/23/22 2212           Code Status History     Date Active Date Inactive Code Status Order ID Comments User Context   07/25/2013 0549 07/25/2013 1721 Full Code [id number]  Eduard Clos, MD Inpatient          IV Access:   Peripheral IV   Procedures and diagnostic studies:   CT Abdomen Pelvis W Contrast  Result Date: 08/23/2022 CLINICAL DATA:  Abdominal pain, acute, nonlocalized EXAM: CT ABDOMEN AND PELVIS WITH CONTRAST TECHNIQUE: Multidetector CT imaging of the abdomen and pelvis was performed using the standard protocol following bolus administration of intravenous contrast. RADIATION DOSE REDUCTION: This exam was performed according to the departmental dose-optimization program which includes automated exposure control, adjustment of the mA and/or kV according to patient size and/or use of iterative reconstruction technique. CONTRAST:  93mL OMNIPAQUE IOHEXOL 350 MG/ML SOLN COMPARISON:  CT abdomen pelvis 07/25/2013 FINDINGS: Lower chest: Bibasilar hypoventilatory change. Hepatobiliary: There are few hypodense liver lesions consistent with small cysts. The gallbladder is unremarkable. Pancreas: Unremarkable. No pancreatic ductal dilatation or surrounding inflammatory changes. Spleen: Normal in size without focal abnormality. Adrenals/Urinary Tract: Adrenal glands are unremarkable. No hydronephrosis or nephrolithiasis. Bladder is minimally distended. Stomach/Bowel: Mild distal esophageal wall thickening. Marked distension of the stomach. Decompressed duodenum and proximal small bowel. Multiple loops of dilated and fluid-filled small bowel with areas of mild mesenteric edema. There is decompression of the distal small bowel with transition point identified in the right lower quadrant (series 3, image 65). The colon is decompressed. Prior appendectomy. Vascular/Lymphatic: No significant vascular findings are present. No enlarged abdominal or pelvic lymph nodes. Reproductive: Unremarkable. Other: No abdominal wall hernia.  Trace free fluid in the pelvis. Musculoskeletal: No acute or  significant osseous findings. IMPRESSION: Multiple loops of dilated and fluid-filled small bowel with transition point in the  right lower quadrant, suspicious for early or partial small-bowel obstruction versus small bowel enteritis. Marked distension of the stomach. Distal esophageal wall thickening which may suggest esophagitis. Trace free fluid in the pelvis. Electronically Signed   By: Caprice Renshaw M.D.   On: 08/23/2022 16:12     Medical Consultants:   None.   Subjective:    [facility] Jimmy Sanchez has not passed gas no bowel movements denies abdominal pain  Objective:    Vitals:   08/24/22 0100 08/24/22 0200 08/24/22 0210 08/24/22 0734  BP: 126/81  132/76 121/71  Pulse: 78  68 68  Resp: 13  16 18   Temp:   98.7 F (37.1 C) 98.6 F (37 C)  TempSrc:   Oral Oral  SpO2: 95%  98% 98%  Weight:  83.5 kg    Height:  5\' 8"  (1.727 m)     SpO2: 98 %   Intake/Output Summary (Last 24 hours) at 08/24/2022 0757 Last data filed at 08/24/2022 0701 Gross per 24 hour  Intake 1762.55 ml  Output --  Net 1762.55 ml   Filed Weights   08/23/22 1328 08/24/22 0200  Weight: 86.2 kg 83.5 kg    Exam: General exam: In no acute distress. Respiratory system: Good air movement and clear to auscultation. Cardiovascular system: S1 & S2 heard, RRR. No JVD. Gastrointestinal system: Abdomen is nondistended, soft and nontender.  Central nervous system: Alert and oriented. No focal neurological deficits. Extremities: No pedal edema. Skin: No rashes, lesions or ulcers Data Reviewed:    Labs: Basic Metabolic Panel: Recent Labs  Lab 08/23/22 1337 08/24/22 0105  NA 140 139  K 3.6 3.5  CL 108 108  CO2 23 23  GLUCOSE 129* 113*  BUN 13 12  CREATININE 0.83 0.79  CALCIUM 9.7 8.7*   GFR Estimated Creatinine Clearance: 125.3 mL/min (by C-G formula based on SCr of 0.79 mg/dL). Liver Function Tests: Recent Labs  Lab 08/23/22 1337  AST 18  ALT 14  ALKPHOS 48  BILITOT 0.2*  PROT 8.0  ALBUMIN 4.5   Recent Labs  Lab 08/23/22 1337  LIPASE 29   No results for input(s): "AMMONIA" in the last 168  hours. Coagulation profile No results for input(s): "INR", "PROTIME" in the last 168 hours. COVID-19 Labs  No results for input(s): "DDIMER", "FERRITIN", "LDH", "CRP" in the last 72 hours.  No results found for: "SARSCOV2NAA"  CBC: Recent Labs  Lab 08/23/22 1336 08/24/22 0105  WBC 14.5* 12.3*  NEUTROABS 13.4*  --   HGB 16.9 14.8  HCT 47.8 42.5  MCV 87.7 88.2  PLT 297 257   Cardiac Enzymes: No results for input(s): "CKTOTAL", "CKMB", "CKMBINDEX", "TROPONINI" in the last 168 hours. BNP (last 3 results) No results for input(s): "PROBNP" in the last 8760 hours. CBG: No results for input(s): "GLUCAP" in the last 168 hours. D-Dimer: No results for input(s): "DDIMER" in the last 72 hours. Hgb A1c: No results for input(s): "HGBA1C" in the last 72 hours. Lipid Profile: No results for input(s): "CHOL", "HDL", "LDLCALC", "TRIG", "CHOLHDL", "LDLDIRECT" in the last 72 hours. Thyroid function studies: No results for input(s): "TSH", "T4TOTAL", "T3FREE", "THYROIDAB" in the last 72 hours.  Invalid input(s): "FREET3" Anemia work up: No results for input(s): "VITAMINB12", "FOLATE", "FERRITIN", "TIBC", "IRON", "RETICCTPCT" in the last 72 hours. Sepsis Labs: Recent Labs  Lab 08/23/22 1336 08/24/22 0105  WBC 14.5* 12.3*  Microbiology No results found for this or any previous visit (from the past 240 hour(s)).   Medications:    Continuous Infusions:  sodium chloride 125 mL/hr at 08/24/22 0747      LOS: 0 days   Charlynne Cousins  [facility]  08/24/2022, 7:57 AM

## 2022-08-24 NOTE — Progress Notes (Signed)
Mobility Specialist Progress Note:   08/24/22 1403  Mobility  Activity Ambulated independently in hallway  Level of Assistance Independent  Assistive Device Other (Comment) (IV Pole)  Distance Ambulated (ft) 250 ft  Activity Response Tolerated well  $Mobility charge 1 Mobility   Pt received in bed and agreeable. No complaints. Pt left in bed with all needs met and call bell in reach.   Zen Felling Mobility Specialist-Acute Rehab Secure Chat only

## 2022-08-24 NOTE — Plan of Care (Signed)
  Problem: Education: Goal: Knowledge of General Education information will improve Description Including pain rating scale, medication(s)/side effects and non-pharmacologic comfort measures Outcome: Progressing   Problem: Health Behavior/Discharge Planning: Goal: Ability to manage health-related needs will improve Outcome: Progressing   Problem: Clinical Measurements: Goal: Ability to maintain clinical measurements within normal limits will improve Outcome: Progressing Goal: Will remain free from infection Outcome: Progressing Goal: Diagnostic test results will improve Outcome: Progressing Goal: Respiratory complications will improve Outcome: Progressing Goal: Cardiovascular complication will be avoided Outcome: Progressing   Problem: Activity: Goal: Risk for activity intolerance will decrease Outcome: Progressing   Problem: Safety: Goal: Ability to remain free from injury will improve Outcome: Progressing   Problem: Skin Integrity: Goal: Risk for impaired skin integrity will decrease Outcome: Progressing   

## 2022-08-25 DIAGNOSIS — K209 Esophagitis, unspecified without bleeding: Secondary | ICD-10-CM | POA: Diagnosis not present

## 2022-08-25 DIAGNOSIS — K56609 Unspecified intestinal obstruction, unspecified as to partial versus complete obstruction: Secondary | ICD-10-CM | POA: Diagnosis not present

## 2022-08-25 DIAGNOSIS — K56600 Partial intestinal obstruction, unspecified as to cause: Secondary | ICD-10-CM | POA: Diagnosis not present

## 2022-08-25 MED ORDER — ACETAMINOPHEN 500 MG PO TABS: 1000.0000 mg | ORAL_TABLET | Freq: Four times a day (QID) | ORAL | Status: DC | PRN

## 2022-08-25 MED ORDER — POTASSIUM CHLORIDE 10 MEQ/100ML IV SOLN
10.0000 meq | INTRAVENOUS | Status: AC
  Administered 2022-08-25 (×6): 10 meq via INTRAVENOUS
  Filled 2022-08-25: qty 100

## 2022-08-25 MED ORDER — OXYCODONE HCL 5 MG PO TABS: 5.0000 mg | ORAL_TABLET | ORAL | Status: DC | PRN

## 2022-08-25 MED ORDER — SODIUM CHLORIDE 0.9 % IV SOLN: INTRAVENOUS | Status: AC

## 2022-08-25 NOTE — Progress Notes (Signed)
[facility] PROGRESS NOTE    Progress Note  Jimmy Sanchez  [id number] DOB: 03-03-1981 DOA: 08/23/2022 PCP: Patient, No Pcp Per     Brief Narrative:   Jacinto Reap Judith Part is an 41 y.o. male past medical history significant for appendectomy, small intestine surgery as an infant, history of bowel obstruction x3 comes in complaining of abdominal pain and distention woke up at 1 AM on the day of admission with severe abdominal pain nausea and vomiting, white count of 15, CT scan of the abdomen pelvis showed dilated loops with a transition point in the right lower quadrant suspicious for partial small bowel obstruction.   Assessment/Plan:   Partial small bowel obstruction (HCC) CT scan of the abdomen pelvis as below. General surgery has been consulted recommending to hold on NG tube and continue conservative management.  May have ice chips, continue to ambulate, continue IV fluid hydration, try to limit narcotics for pain. Try to keep potassium greater than 4 magnesium greater than 2. Continue Zofran for nausea and vomiting. Passing gas no bowel movements.  Possible esophagitis CT scan of the abdomen pelvis showed distal esophageal thickening. Started on a PPI.  Leukocytosis: Likely reactive has remained afebrile white blood cell count is down.   DVT prophylaxis: lovenox Family Communication:none Status is: Observation The patient will require care spanning > 2 midnights and should be moved to inpatient because: Acute small bowel obstruction    Code Status:     Code Status Orders  (From admission, onward)           Start     Ordered   08/23/22 2212  Full code  Continuous        08/23/22 2212           Code Status History     Date Active Date Inactive Code Status Order ID Comments User Context   07/25/2013 0549 07/25/2013 1721 Full Code [id number]  Rise Patience, MD Inpatient         IV Access:   Peripheral  IV   Procedures and diagnostic studies:   CT Abdomen Pelvis W Contrast  Result Date: 08/23/2022 CLINICAL DATA:  Abdominal pain, acute, nonlocalized EXAM: CT ABDOMEN AND PELVIS WITH CONTRAST TECHNIQUE: Multidetector CT imaging of the abdomen and pelvis was performed using the standard protocol following bolus administration of intravenous contrast. RADIATION DOSE REDUCTION: This exam was performed according to the departmental dose-optimization program which includes automated exposure control, adjustment of the mA and/or kV according to patient size and/or use of iterative reconstruction technique. CONTRAST:  40mL OMNIPAQUE IOHEXOL 350 MG/ML SOLN COMPARISON:  CT abdomen pelvis 07/25/2013 FINDINGS: Lower chest: Bibasilar hypoventilatory change. Hepatobiliary: There are few hypodense liver lesions consistent with small cysts. The gallbladder is unremarkable. Pancreas: Unremarkable. No pancreatic ductal dilatation or surrounding inflammatory changes. Spleen: Normal in size without focal abnormality. Adrenals/Urinary Tract: Adrenal glands are unremarkable. No hydronephrosis or nephrolithiasis. Bladder is minimally distended. Stomach/Bowel: Mild distal esophageal wall thickening. Marked distension of the stomach. Decompressed duodenum and proximal small bowel. Multiple loops of dilated and fluid-filled small bowel with areas of mild mesenteric edema. There is decompression of the distal small bowel with transition point identified in the right lower quadrant (series 3, image 65). The colon is decompressed. Prior appendectomy. Vascular/Lymphatic: No significant vascular findings are present. No enlarged abdominal or pelvic lymph nodes. Reproductive: Unremarkable. Other: No abdominal wall hernia.  Trace free fluid in the pelvis. Musculoskeletal: No acute or significant osseous findings. IMPRESSION: Multiple  loops of dilated and fluid-filled small bowel with transition point in the right lower quadrant, suspicious  for early or partial small-bowel obstruction versus small bowel enteritis. Marked distension of the stomach. Distal esophageal wall thickening which may suggest esophagitis. Trace free fluid in the pelvis. Electronically Signed   By: Caprice Renshaw M.D.   On: 08/23/2022 16:12     Medical Consultants:   None.   Subjective:    [facility] Lillie Columbia relates his abdominal pain is better he feels much better than yesterday.  Objective:    Vitals:   08/24/22 1624 08/24/22 1945 08/25/22 0437 08/25/22 0730  BP: 122/75 134/81 135/85 121/66  Pulse: 71 70 70 64  Resp: 16 17 17 14   Temp: 98.9 F (37.2 C) 98.5 F (36.9 C) 98.6 F (37 C) 99.1 F (37.3 C)  TempSrc: Oral Oral Oral Oral  SpO2: 99% 97% 98% 98%  Weight:      Height:       SpO2: 98 %   Intake/Output Summary (Last 24 hours) at 08/25/2022 0754 Last data filed at 08/24/2022 1501 Gross per 24 hour  Intake 1286.97 ml  Output --  Net 1286.97 ml    Filed Weights   08/23/22 1328 08/24/22 0200  Weight: 86.2 kg 83.5 kg    Exam: General exam: In no acute distress. Respiratory system: Good air movement and clear to auscultation. Cardiovascular system: S1 & S2 heard, RRR. No JVD. Gastrointestinal system: Abdomen is nondistended, soft and nontender.  Extremities: No pedal edema. Skin: No rashes, lesions or ulcers Psychiatry: Judgement and insight appear normal. Mood & affect appropriate. Data Reviewed:    Labs: Basic Metabolic Panel: Recent Labs  Lab 08/23/22 1337 08/24/22 0105 08/24/22 0942  NA 140 139  --   K 3.6 3.5  --   CL 108 108  --   CO2 23 23  --   GLUCOSE 129* 113*  --   BUN 13 12  --   CREATININE 0.83 0.79  --   CALCIUM 9.7 8.7*  --   MG  --   --  2.0    GFR Estimated Creatinine Clearance: 125.3 mL/min (by C-G formula based on SCr of 0.79 mg/dL). Liver Function Tests: Recent Labs  Lab 08/23/22 1337  AST 18  ALT 14  ALKPHOS 48  BILITOT 0.2*  PROT 8.0  ALBUMIN 4.5    Recent Labs  Lab  08/23/22 1337  LIPASE 29    No results for input(s): "AMMONIA" in the last 168 hours. Coagulation profile No results for input(s): "INR", "PROTIME" in the last 168 hours. COVID-19 Labs  No results for input(s): "DDIMER", "FERRITIN", "LDH", "CRP" in the last 72 hours.  No results found for: "SARSCOV2NAA"  CBC: Recent Labs  Lab 08/23/22 1336 08/24/22 0105  WBC 14.5* 12.3*  NEUTROABS 13.4*  --   HGB 16.9 14.8  HCT 47.8 42.5  MCV 87.7 88.2  PLT 297 257    Cardiac Enzymes: No results for input(s): "CKTOTAL", "CKMB", "CKMBINDEX", "TROPONINI" in the last 168 hours. BNP (last 3 results) No results for input(s): "PROBNP" in the last 8760 hours. CBG: No results for input(s): "GLUCAP" in the last 168 hours. D-Dimer: No results for input(s): "DDIMER" in the last 72 hours. Hgb A1c: No results for input(s): "HGBA1C" in the last 72 hours. Lipid Profile: No results for input(s): "CHOL", "HDL", "LDLCALC", "TRIG", "CHOLHDL", "LDLDIRECT" in the last 72 hours. Thyroid function studies: No results for input(s): "TSH", "T4TOTAL", "T3FREE", "THYROIDAB" in the last  72 hours.  Invalid input(s): "FREET3" Anemia work up: No results for input(s): "VITAMINB12", "FOLATE", "FERRITIN", "TIBC", "IRON", "RETICCTPCT" in the last 72 hours. Sepsis Labs: Recent Labs  Lab 08/23/22 1336 08/24/22 0105  WBC 14.5* 12.3*    Microbiology No results found for this or any previous visit (from the past 240 hour(s)).   Medications:    Continuous Infusions:  sodium chloride 125 mL/hr at 08/24/22 1532      LOS: 1 day   Marinda Elk  [facility]  08/25/2022, 7:54 AM

## 2022-08-25 NOTE — Progress Notes (Signed)
Progress Note     Subjective: Abdominal pain has resolved. No nausea. Has had a bowel movement.   Objective: Vital signs in last 24 hours: Temp:  [98.5 F (36.9 C)-99.1 F (37.3 C)] 99.1 F (37.3 C) (09/21 0730) Pulse Rate:  [64-71] 64 (09/21 0730) Resp:  [14-17] 14 (09/21 0730) BP: (121-135)/(66-85) 121/66 (09/21 0730) SpO2:  [97 %-99 %] 98 % (09/21 0730) Last BM Date : 08/25/22  Intake/Output from previous day: 09/20 0701 - 09/21 0700 In: 1787 [I.V.:1486.9; IV Piggyback:300.1] Out: -  Intake/Output this shift: No intake/output data recorded.  PE: General: pleasant, WD, male who is laying in bed in NAD Abd: soft, NT, ND, well healed periumbilical and RLQ scars MSK: all 4 extremities are symmetrical with no cyanosis, clubbing, or edema. Skin: warm and dry Psych: A&Ox3 with an appropriate affect.    Lab Results:  Recent Labs    08/23/22 1336 08/24/22 0105  WBC 14.5* 12.3*  HGB 16.9 14.8  HCT 47.8 42.5  PLT 297 257   BMET Recent Labs    08/23/22 1337 08/24/22 0105  NA 140 139  K 3.6 3.5  CL 108 108  CO2 23 23  GLUCOSE 129* 113*  BUN 13 12  CREATININE 0.83 0.79  CALCIUM 9.7 8.7*   PT/INR No results for input(s): "LABPROT", "INR" in the last 72 hours. CMP     Component Value Date/Time   NA 139 08/24/2022 0105   K 3.5 08/24/2022 0105   CL 108 08/24/2022 0105   CO2 23 08/24/2022 0105   GLUCOSE 113 (H) 08/24/2022 0105   BUN 12 08/24/2022 0105   CREATININE 0.79 08/24/2022 0105   CREATININE 0.87 09/12/2012 1242   CALCIUM 8.7 (L) 08/24/2022 0105   PROT 8.0 08/23/2022 1337   ALBUMIN 4.5 08/23/2022 1337   AST 18 08/23/2022 1337   ALT 14 08/23/2022 1337   ALKPHOS 48 08/23/2022 1337   BILITOT 0.2 (L) 08/23/2022 1337   GFRNONAA >60 08/24/2022 0105   GFRAA >60 12/24/2017 0305   Lipase     Component Value Date/Time   LIPASE 29 08/23/2022 1337       Studies/Results: CT Abdomen Pelvis W Contrast  Result Date: 08/23/2022 CLINICAL DATA:   Abdominal pain, acute, nonlocalized EXAM: CT ABDOMEN AND PELVIS WITH CONTRAST TECHNIQUE: Multidetector CT imaging of the abdomen and pelvis was performed using the standard protocol following bolus administration of intravenous contrast. RADIATION DOSE REDUCTION: This exam was performed according to the departmental dose-optimization program which includes automated exposure control, adjustment of the mA and/or kV according to patient size and/or use of iterative reconstruction technique. CONTRAST:  76mL OMNIPAQUE IOHEXOL 350 MG/ML SOLN COMPARISON:  CT abdomen pelvis 07/25/2013 FINDINGS: Lower chest: Bibasilar hypoventilatory change. Hepatobiliary: There are few hypodense liver lesions consistent with small cysts. The gallbladder is unremarkable. Pancreas: Unremarkable. No pancreatic ductal dilatation or surrounding inflammatory changes. Spleen: Normal in size without focal abnormality. Adrenals/Urinary Tract: Adrenal glands are unremarkable. No hydronephrosis or nephrolithiasis. Bladder is minimally distended. Stomach/Bowel: Mild distal esophageal wall thickening. Marked distension of the stomach. Decompressed duodenum and proximal small bowel. Multiple loops of dilated and fluid-filled small bowel with areas of mild mesenteric edema. There is decompression of the distal small bowel with transition point identified in the right lower quadrant (series 3, image 65). The colon is decompressed. Prior appendectomy. Vascular/Lymphatic: No significant vascular findings are present. No enlarged abdominal or pelvic lymph nodes. Reproductive: Unremarkable. Other: No abdominal wall hernia.  Trace free fluid in the  pelvis. Musculoskeletal: No acute or significant osseous findings. IMPRESSION: Multiple loops of dilated and fluid-filled small bowel with transition point in the right lower quadrant, suspicious for early or partial small-bowel obstruction versus small bowel enteritis. Marked distension of the stomach. Distal  esophageal wall thickening which may suggest esophagitis. Trace free fluid in the pelvis. Electronically Signed   By: Maurine Simmering M.D.   On: 08/23/2022 16:12    Anti-infectives: Anti-infectives (From admission, onward)    None        Assessment/Plan pSBO - history abdominal surgery as an infant and of appendectomy - CT w/ Multiple loops of dilated and fluid-filled small bowel with transition point in the right lower quadrant, suspicious for early or partial small-bowel obstruction versus small bowel enteritis. - No current indication for emergency surgery - did not require NGT and is now passing flatus and has had BM - start CLD and ADAT soft   FEN: CLD ADAT soft ID: none VTE: lovenox  I reviewed hospitalist notes, last 24 h vitals and pain scores, last 48 h intake and output, last 24 h labs and trends, and last 24 h imaging results.    LOS: 1 day   [facility] 08/25/2022, 3:29 PM Please see Amion for pager number during day hours 7:00am-4:30pm

## 2022-08-26 DIAGNOSIS — K209 Esophagitis, unspecified without bleeding: Secondary | ICD-10-CM | POA: Diagnosis not present

## 2022-08-26 DIAGNOSIS — K56609 Unspecified intestinal obstruction, unspecified as to partial versus complete obstruction: Secondary | ICD-10-CM | POA: Diagnosis not present

## 2022-08-26 DIAGNOSIS — K56600 Partial intestinal obstruction, unspecified as to cause: Secondary | ICD-10-CM | POA: Diagnosis not present

## 2022-08-26 LAB — BASIC METABOLIC PANEL
Anion gap: 8 (ref 5–15)
BUN: 8 mg/dL (ref 6–20)
CO2: 24 mmol/L (ref 22–32)
Calcium: 8.9 mg/dL (ref 8.9–10.3)
Chloride: 104 mmol/L (ref 98–111)
Creatinine, Ser: 0.78 mg/dL (ref 0.61–1.24)
GFR, Estimated: >60 mL/min (ref >60)
Glucose, Bld: 100 mg/dL — ABNORMAL HIGH (ref 70–99)
Potassium: 3.6 mmol/L (ref 3.5–5.1)
Sodium: 136 mmol/L (ref 135–145)

## 2022-08-26 MED ORDER — ACETAMINOPHEN 500 MG PO TABS: 1000.0000 mg | ORAL_TABLET | Freq: Four times a day (QID) | ORAL | 0 refills | Status: AC | PRN

## 2022-08-26 MED ORDER — PANTOPRAZOLE SODIUM 40 MG PO TBEC: 40.0000 mg | DELAYED_RELEASE_TABLET | Freq: Every day | ORAL | 2 refills | Status: AC

## 2022-08-26 NOTE — Progress Notes (Signed)
Patient has been discharged. AVS has been given to patient and patient states he understands.

## 2022-08-26 NOTE — Discharge Summary (Signed)
Physician Discharge Summary   Patient: Jimmy Sanchez MRN: [id number] DOB: [birth year]  Admit date:     08/23/2022  Discharge date: 08/26/22  Discharge Physician: Oswald Hillock   PCP: Patient, No Pcp Per   Recommendations at discharge:   Follow-up PCP as outpatient Follow-up gastroenterology as outpatient  Discharge Diagnoses: Principal Problem:   Partial small bowel obstruction ([place]) Active Problems:   Esophagitis   Small bowel obstruction ([facility])  Resolved Problems:   * No resolved hospital problems. *  Hospital Course: 41 year old male with a history of appendectomy, small ultrasound surgery as infant, history of bowel obstruction x3 came with abdominal pain and distention.  Which woke patient up at 1 AM on day of admission with severe abdominal pain nausea vomiting.   CT abdomen/pelvis showed dilated loops with transition point in the right lower quadrant, suspicious for partial small bowel obstruction  Assessment and Plan:  Partial small bowel obstruction -Found on CT abdomen/pelvis as above -General surgery was consulted, recommended to hold NG tube and continue conservative management -Started on soft diet; tolerating diet well   ?  Esophagitis -Seen on CT abdomen/pelvis which showed distal esophageal thickening -Started on PPI -Need follow-up with GI as outpatient   Leukocytosis -Likely reactive        Consultants: General surgery Procedures performed: None Disposition: Home Diet recommendation:  Discharge Diet Orders (From admission, onward)     Start     Ordered   08/26/22 0000  Diet - low sodium heart healthy        08/26/22 1025           Regular diet DISCHARGE MEDICATION: Allergies as of 08/26/2022   No Known Allergies      Medication List     TAKE these medications    acetaminophen 500 MG tablet Commonly known as: TYLENOL Take 2 tablets (1,000 mg total) by mouth every 6 (six) hours as needed for moderate pain, fever or  mild pain.        Discharge Exam: Filed Weights   08/23/22 1328 08/24/22 0200  Weight: 86.2 kg 83.5 kg   General-appears in no acute distress Heart-S1-S2, regular, no murmur auscultated Lungs-clear to auscultation bilaterally, no wheezing or crackles auscultated Abdomen-soft, nontender, no organomegaly Extremities-no edema in the lower extremities Neuro-alert, oriented x3, no focal deficit noted  Condition at discharge: good  The results of significant diagnostics from this hospitalization (including imaging, microbiology, ancillary and laboratory) are listed below for reference.   Imaging Studies: CT Abdomen Pelvis W Contrast  Result Date: 08/23/2022 CLINICAL DATA:  Abdominal pain, acute, nonlocalized EXAM: CT ABDOMEN AND PELVIS WITH CONTRAST TECHNIQUE: Multidetector CT imaging of the abdomen and pelvis was performed using the standard protocol following bolus administration of intravenous contrast. RADIATION DOSE REDUCTION: This exam was performed according to the departmental dose-optimization program which includes automated exposure control, adjustment of the mA and/or kV according to patient size and/or use of iterative reconstruction technique. CONTRAST:  60mL OMNIPAQUE IOHEXOL 350 MG/ML SOLN COMPARISON:  CT abdomen pelvis 07/25/2013 FINDINGS: Lower chest: Bibasilar hypoventilatory change. Hepatobiliary: There are few hypodense liver lesions consistent with small cysts. The gallbladder is unremarkable. Pancreas: Unremarkable. No pancreatic ductal dilatation or surrounding inflammatory changes. Spleen: Normal in size without focal abnormality. Adrenals/Urinary Tract: Adrenal glands are unremarkable. No hydronephrosis or nephrolithiasis. Bladder is minimally distended. Stomach/Bowel: Mild distal esophageal wall thickening. Marked distension of the stomach. Decompressed duodenum and proximal small bowel. Multiple loops of dilated and fluid-filled small bowel with  areas of mild mesenteric  edema. There is decompression of the distal small bowel with transition point identified in the right lower quadrant (series 3, image 65). The colon is decompressed. Prior appendectomy. Vascular/Lymphatic: No significant vascular findings are present. No enlarged abdominal or pelvic lymph nodes. Reproductive: Unremarkable. Other: No abdominal wall hernia.  Trace free fluid in the pelvis. Musculoskeletal: No acute or significant osseous findings. IMPRESSION: Multiple loops of dilated and fluid-filled small bowel with transition point in the right lower quadrant, suspicious for early or partial small-bowel obstruction versus small bowel enteritis. Marked distension of the stomach. Distal esophageal wall thickening which may suggest esophagitis. Trace free fluid in the pelvis. Electronically Signed   By: Caprice Renshaw M.D.   On: 08/23/2022 16:12    Microbiology: Results for orders placed or performed during the hospital encounter of 08/13/19  Culture, group A strep     Status: None   Collection Time: 08/13/19  3:24 PM   Specimen: Throat  Result Value Ref Range Status   Specimen Description THROAT  Final   Special Requests NONE  Final   Culture   Final    NO GROUP A STREP (S.PYOGENES) ISOLATED Performed at [facility], [address] [place], [place] [address]    Report Status 08/15/2019 FINAL  Final    Labs: CBC: Recent Labs  Lab 08/23/22 1336 08/24/22 0105  WBC 14.5* 12.3*  NEUTROABS 13.4*  --   HGB 16.9 14.8  HCT 47.8 42.5  MCV 87.7 88.2  PLT 297 257   Basic Metabolic Panel: Recent Labs  Lab 08/23/22 1337 08/24/22 0105 08/24/22 0942 08/26/22 0303  NA 140 139  --  136  K 3.6 3.5  --  3.6  CL 108 108  --  104  CO2 23 23  --  24  GLUCOSE 129* 113*  --  100*  BUN 13 12  --  8  CREATININE 0.83 0.79  --  0.78  CALCIUM 9.7 8.7*  --  8.9  MG  --   --  2.0  --    Liver Function Tests: Recent Labs  Lab 08/23/22 1337  AST 18  ALT 14  ALKPHOS 48  BILITOT 0.2*  PROT 8.0   ALBUMIN 4.5   CBG: No results for input(s): "GLUCAP" in the last 168 hours.  Discharge time spent: greater than 30 minutes.  Signed: Meredeth Ide, MD [facility] 08/26/2022

## 2022-08-26 NOTE — Progress Notes (Addendum)
   Progress Note     Subjective: Doing great.  Tolerated a soft diet last night.  + BM.  No nausea, no abdominal pain   Objective: Vital signs in last 24 hours: Temp:  [98.7 F (37.1 C)-99.2 F (37.3 C)] 98.7 F (37.1 C) (09/22 0810) Pulse Rate:  [61-66] 61 (09/22 0810) Resp:  [14-20] 18 (09/22 0810) BP: (121-135)/(61-88) 122/69 (09/22 0810) SpO2:  [97 %-100 %] 97 % (09/22 0810) Last BM Date : 08/25/22  Intake/Output from previous day: 09/21 0701 - 09/22 0700 In: 200 [P.O.:200] Out: -  Intake/Output this shift: No intake/output data recorded.  PE: General: pleasant, WD, male who is laying in bed in NAD Abd: soft, NT, ND, well healed periumbilical and RLQ scars    Lab Results:  Recent Labs    08/23/22 1336 08/24/22 0105  WBC 14.5* 12.3*  HGB 16.9 14.8  HCT 47.8 42.5  PLT 297 257   BMET Recent Labs    08/24/22 0105 08/26/22 0303  NA 139 136  K 3.5 3.6  CL 108 104  CO2 23 24  GLUCOSE 113* 100*  BUN 12 8  CREATININE 0.79 0.78  CALCIUM 8.7* 8.9   PT/INR No results for input(s): "LABPROT", "INR" in the last 72 hours. CMP     Component Value Date/Time   NA 136 08/26/2022 0303   K 3.6 08/26/2022 0303   CL 104 08/26/2022 0303   CO2 24 08/26/2022 0303   GLUCOSE 100 (H) 08/26/2022 0303   BUN 8 08/26/2022 0303   CREATININE 0.78 08/26/2022 0303   CREATININE 0.87 09/12/2012 1242   CALCIUM 8.9 08/26/2022 0303   PROT 8.0 08/23/2022 1337   ALBUMIN 4.5 08/23/2022 1337   AST 18 08/23/2022 1337   ALT 14 08/23/2022 1337   ALKPHOS 48 08/23/2022 1337   BILITOT 0.2 (L) 08/23/2022 1337   GFRNONAA >60 08/26/2022 0303   GFRAA >60 12/24/2017 0305   Lipase     Component Value Date/Time   LIPASE 29 08/23/2022 1337       Studies/Results: No results found.  Anti-infectives: Anti-infectives (From admission, onward)    None        Assessment/Plan pSBO - tolerating soft diet with no further issues -surgically stable for DC home.  D/w primary  service.   FEN:  soft ID: none VTE: lovenox  I reviewed hospitalist notes, last 24 h vitals and pain scores, last 48 h intake and output, last 24 h labs and trends, and last 24 h imaging results.    LOS: 2 days   Henreitta Cea, [facility] 08/26/2022, 10:02 AM Please see Amion for pager number during day hours 7:00am-4:30pm

## 2022-08-31 NOTE — H&P (Signed)
History and Physical      Jimmy Sanchez NFA:[id number] DOB: 02-10-1981 DOA: 08/23/2022   PCP: Patient, No Pcp Per   Patient coming from: Home   Chief Complaint: Abdominal pain   HPI: Jimmy Sanchez is a 41 y.o. male with medical history significant of appendectomy and small intestine surgery as an infant presenting with a complaints of abdominal pain and distention.  History provided by patient and his wife at bedside.  Yesterday he had some spicy snacks in the afternoon and later had dinner in the evening.  Around 1:30 in the AM he woke up with severe generalized abdominal pain.  He also had nausea and vomiting at home.  Patient states he now feels much better after receiving medications in the ED.  States he felt nauseous while trying to drink some water in the ED but did not vomit.  Reports having a regular bowel movement this morning.  He reports history of 2 prior abdominal surgeries.  One of his abdominal surgeries was done when he was an infant and he had another surgery done to remove his appendix several years ago.  Reports history of bowel obstructions 3 times in the past and wife is concerned this happens every times after the patient eats large amounts of food at a time.  Patient has no other complaints.  Denies fevers, cough, shortness of breath, or chest pain.   ED course: Afebrile.  WBC 14.5, lipase normal, no elevation of LFTs, UA without signs of infection.  CT abdomen pelvis showing multiple loops of dilated and fluid-filled small bowel with transition point in the right lower quadrant suspicious for early or partial SBO versus small bowel enteritis.  Marked distention of the stomach.  Distal esophageal thickening which may suggest esophagitis.  Patient was given Norco, morphine, Zofran, and 1 L normal saline bolus.  General surgery consulted and recommended holding off NG tube until patient is seen by their team.   Review of Systems:  Review of Systems  All  other systems reviewed and are negative.         Past Medical History:  Diagnosis Date   Medical history non-contributory             Past Surgical History:  Procedure Laterality Date   ANKLE FRACTURE SURGERY       APPENDECTOMY       SMALL INTESTINE SURGERY        as an infant       reports that he has never smoked. He has never used smokeless tobacco. He reports that he does not drink alcohol and does not use drugs.   No Known Allergies        Family History  Problem Relation Age of Onset   Heart disease Mother     Other Neg Hx               Prior to Admission medications   Medication Sig Start Date End Date Taking? Authorizing Provider  ibuprofen (ADVIL,MOTRIN) 200 MG tablet Take 200 mg by mouth every 6 (six) hours as needed for moderate pain.       [provider]  lidocaine (XYLOCAINE) 2 % solution Use as directed 15 mLs in the mouth or throat as needed for mouth pain. 08/13/19     [facility], [place], [place]      Physical Exam:       Vitals:    08/23/22 2030 08/23/22 2054 08/23/22 2100 08/23/22 2130  BP:  130/84   127/79 115/75  Pulse: 87   88 86  Resp: 18   15 16   Temp:   98 F (36.7 C)      TempSrc:          SpO2: 98%   97% 97%  Weight:          Height:              Physical Exam Vitals reviewed.  Constitutional:      General: He is not in acute distress. HENT:     Head: Normocephalic and atraumatic.  Eyes:     Extraocular Movements: Extraocular movements intact.  Cardiovascular:     Rate and Rhythm: Normal rate and regular rhythm.     Pulses: Normal pulses.  Pulmonary:     Effort: Pulmonary effort is normal. No respiratory distress.     Breath sounds: Normal breath sounds.  Abdominal:     General: Bowel sounds are normal.     Palpations: Abdomen is soft.     Tenderness: There is no abdominal tenderness. There is no guarding.  Musculoskeletal:        General: No swelling or tenderness.     Cervical back: Normal range of motion.   Skin:    General: Skin is warm and dry.  Neurological:     General: No focal deficit present.     Mental Status: He is alert and oriented to person, place, and time.       Labs on Admission: I have personally reviewed following labs and imaging studies   CBC: Last Labs      Recent Labs  Lab 08/23/22 1336  WBC 14.5*  NEUTROABS 13.4*  HGB 16.9  HCT 47.8  MCV 87.7  PLT 297      Basic Metabolic Panel: Last Labs      Recent Labs  Lab 08/23/22 1337  NA 140  K 3.6  CL 108  CO2 23  GLUCOSE 129*  BUN 13  CREATININE 0.83  CALCIUM 9.7      GFR: Estimated Creatinine Clearance: 122.5 mL/min (by C-G formula based on SCr of 0.83 mg/dL). Liver Function Tests: Last Labs      Recent Labs  Lab 08/23/22 1337  AST 18  ALT 14  ALKPHOS 48  BILITOT 0.2*  PROT 8.0  ALBUMIN 4.5      Last Labs      Recent Labs  Lab 08/23/22 1337  LIPASE 29      Last Labs   No results for input(s): "AMMONIA" in the last 168 hours.   Coagulation Profile: Last Labs   No results for input(s): "INR", "PROTIME" in the last 168 hours.   Cardiac Enzymes: Last Labs   No results for input(s): "CKTOTAL", "CKMB", "CKMBINDEX", "TROPONINI" in the last 168 hours.   BNP (last 3 results) Recent Labs (within last 365 days)  No results for input(s): "PROBNP" in the last 8760 hours.   HbA1C: Recent Labs (last 2 labs)   No results for input(s): "HGBA1C" in the last 72 hours.   CBG: Last Labs   No results for input(s): "GLUCAP" in the last 168 hours.   Lipid Profile: Recent Labs (last 2 labs)   No results for input(s): "CHOL", "HDL", "LDLCALC", "TRIG", "CHOLHDL", "LDLDIRECT" in the last 72 hours.   Thyroid Function Tests: Recent Labs (last 2 labs)   No results for input(s): "TSH", "T4TOTAL", "FREET4", "T3FREE", "THYROIDAB" in the last 72 hours.   Anemia Panel: Recent Labs (  last 2 labs)   No results for input(s): "VITAMINB12", "FOLATE", "FERRITIN", "TIBC", "IRON", "RETICCTPCT" in the  last 72 hours.   Urine analysis: Labs (Brief)          Component Value Date/Time    COLORURINE AMBER (A) 08/23/2022 1418    APPEARANCEUR CLOUDY (A) 08/23/2022 1418    LABSPEC 1.030 08/23/2022 1418    PHURINE 5.0 08/23/2022 1418    GLUCOSEU NEGATIVE 08/23/2022 1418    HGBUR SMALL (A) 08/23/2022 1418    BILIRUBINUR NEGATIVE 08/23/2022 1418    BILIRUBINUR neg 09/12/2012 1246    KETONESUR 20 (A) 08/23/2022 1418    PROTEINUR 30 (A) 08/23/2022 1418    UROBILINOGEN 0.2 07/25/2013 0147    NITRITE NEGATIVE 08/23/2022 1418    LEUKOCYTESUR NEGATIVE 08/23/2022 1418        Radiological Exams on Admission:  Imaging Results (Last 48 hours)  CT Abdomen Pelvis W Contrast   Result Date: 08/23/2022 CLINICAL DATA:  Abdominal pain, acute, nonlocalized EXAM: CT ABDOMEN AND PELVIS WITH CONTRAST TECHNIQUE: Multidetector CT imaging of the abdomen and pelvis was performed using the standard protocol following bolus administration of intravenous contrast. RADIATION DOSE REDUCTION: This exam was performed according to the departmental dose-optimization program which includes automated exposure control, adjustment of the mA and/or kV according to patient size and/or use of iterative reconstruction technique. CONTRAST:  59mL OMNIPAQUE IOHEXOL 350 MG/ML SOLN COMPARISON:  CT abdomen pelvis 07/25/2013 FINDINGS: Lower chest: Bibasilar hypoventilatory change. Hepatobiliary: There are few hypodense liver lesions consistent with small cysts. The gallbladder is unremarkable. Pancreas: Unremarkable. No pancreatic ductal dilatation or surrounding inflammatory changes. Spleen: Normal in size without focal abnormality. Adrenals/Urinary Tract: Adrenal glands are unremarkable. No hydronephrosis or nephrolithiasis. Bladder is minimally distended. Stomach/Bowel: Mild distal esophageal wall thickening. Marked distension of the stomach. Decompressed duodenum and proximal small bowel. Multiple loops of dilated and fluid-filled small  bowel with areas of mild mesenteric edema. There is decompression of the distal small bowel with transition point identified in the right lower quadrant (series 3, image 65). The colon is decompressed. Prior appendectomy. Vascular/Lymphatic: No significant vascular findings are present. No enlarged abdominal or pelvic lymph nodes. Reproductive: Unremarkable. Other: No abdominal wall hernia.  Trace free fluid in the pelvis. Musculoskeletal: No acute or significant osseous findings. IMPRESSION: Multiple loops of dilated and fluid-filled small bowel with transition point in the right lower quadrant, suspicious for early or partial small-bowel obstruction versus small bowel enteritis. Marked distension of the stomach. Distal esophageal wall thickening which may suggest esophagitis. Trace free fluid in the pelvis. Electronically Signed   By: Caprice Renshaw M.D.   On: 08/23/2022 16:12       EKG: Ordered and currently pending.   Assessment and Plan   Early or partial SBO versus small bowel enteritis Patient presenting with complaints of generalized abdominal pain abdominal distention, nausea, and vomiting.  His last bowel movement was today.  History of 2 prior abdominal surgeries.  CT abdomen pelvis showing multiple loops of dilated and fluid-filled small bowel with transition point in the right lower quadrant suspicious for early or partial SBO versus small bowel enteritis.  Marked distention of the stomach.  Has mild leukocytosis but no fever or signs of sepsis.  His symptoms have improved after receiving analgesics and antiemetic in the ED.  He is not actively vomiting. General surgery consulted and recommended holding off NG tube until patient is seen by their team. -Keep n.p.o./bowel rest -IV fluid hydration -Pain management -Antiemetic as needed,  EKG ordered to check QT interval -Monitor electrolytes and WBC count   Possible esophagitis CT showing distal esophageal thickening. -Start PPI   DVT  prophylaxis: SCDs Code Status: Full Code Family Communication: Wife at bedside. Consults called: General surgery (Dr. Thermon Leyland) Level of care: Med-Surg Admission status: It is my clinical opinion that referral for OBSERVATION is reasonable and necessary in this patient based on the above information provided. The aforementioned taken together are felt to place the patient at high risk for further clinical deterioration. However, it is anticipated that the patient may be medically stable for discharge from the hospital within 24 to 48 hours.    Shela Leff MD [facility]   If 7PM-7AM, please contact night-coverage [email or web address]   08/23/2022, 9:58 PM
